# Patient Record
Sex: Female | Born: 1960 | Race: White | Hispanic: No | Marital: Single | Smoking: Current some day smoker
Health system: Southern US, Community
[De-identification: ages and names within clinical notes are randomized; demographics above are authoritative.]

## PROBLEM LIST (undated history)

## (undated) DIAGNOSIS — E785 Hyperlipidemia, unspecified: Secondary | ICD-10-CM

## (undated) DIAGNOSIS — F32A Depression, unspecified: Secondary | ICD-10-CM

## (undated) DIAGNOSIS — N809 Endometriosis, unspecified: Secondary | ICD-10-CM

## (undated) DIAGNOSIS — T7840XA Allergy, unspecified, initial encounter: Secondary | ICD-10-CM

## (undated) DIAGNOSIS — F329 Major depressive disorder, single episode, unspecified: Secondary | ICD-10-CM

## (undated) HISTORY — DX: Major depressive disorder, single episode, unspecified: F32.9

## (undated) HISTORY — DX: Endometriosis, unspecified: N80.9

## (undated) HISTORY — PX: OTHER SURGICAL HISTORY: SHX169

## (undated) HISTORY — DX: Hyperlipidemia, unspecified: E78.5

## (undated) HISTORY — DX: Allergy, unspecified, initial encounter: T78.40XA

## (undated) HISTORY — DX: Depression, unspecified: F32.A

---

## 2010-10-27 ENCOUNTER — Emergency Department (HOSPITAL_COMMUNITY): Payer: Self-pay

## 2010-10-27 ENCOUNTER — Observation Stay (HOSPITAL_COMMUNITY)
Admission: EM | Admit: 2010-10-27 | Discharge: 2010-10-28 | Disposition: A | Discharge status: Home / Self Care | Payer: Self-pay | Attending: Family Medicine | Admitting: Family Medicine

## 2010-10-27 DIAGNOSIS — R55 Syncope and collapse: Principal | ICD-10-CM | POA: Insufficient documentation

## 2010-10-27 DIAGNOSIS — R61 Generalized hyperhidrosis: Secondary | ICD-10-CM | POA: Insufficient documentation

## 2010-10-27 DIAGNOSIS — C3490 Malignant neoplasm of unspecified part of unspecified bronchus or lung: Secondary | ICD-10-CM

## 2010-10-27 DIAGNOSIS — R911 Solitary pulmonary nodule: Secondary | ICD-10-CM | POA: Insufficient documentation

## 2010-10-27 DIAGNOSIS — R209 Unspecified disturbances of skin sensation: Secondary | ICD-10-CM | POA: Insufficient documentation

## 2010-10-27 DIAGNOSIS — R5383 Other fatigue: Secondary | ICD-10-CM | POA: Insufficient documentation

## 2010-10-27 DIAGNOSIS — R110 Nausea: Secondary | ICD-10-CM | POA: Insufficient documentation

## 2010-10-27 DIAGNOSIS — F410 Panic disorder [episodic paroxysmal anxiety] without agoraphobia: Secondary | ICD-10-CM | POA: Insufficient documentation

## 2010-10-27 DIAGNOSIS — R51 Headache: Secondary | ICD-10-CM | POA: Insufficient documentation

## 2010-10-27 DIAGNOSIS — R0602 Shortness of breath: Secondary | ICD-10-CM | POA: Insufficient documentation

## 2010-10-27 DIAGNOSIS — R5381 Other malaise: Secondary | ICD-10-CM | POA: Insufficient documentation

## 2010-10-27 DIAGNOSIS — F17200 Nicotine dependence, unspecified, uncomplicated: Secondary | ICD-10-CM | POA: Insufficient documentation

## 2010-10-27 LAB — POCT I-STAT, CHEM 8
Calcium, Ion: 1.2 mmol/L (ref 1.12–1.32)
Chloride: 103 mEq/L (ref 96–112)
Creatinine, Ser: 0.8 mg/dL (ref 0.50–1.10)
Glucose, Bld: 130 mg/dL — ABNORMAL HIGH (ref 70–99)
HCT: 44 % (ref 36.0–46.0)
Hemoglobin: 15 g/dL (ref 12.0–15.0)

## 2010-10-27 LAB — URINALYSIS, ROUTINE W REFLEX MICROSCOPIC
Nitrite: NEGATIVE
Specific Gravity, Urine: 1.016 (ref 1.005–1.030)
Urobilinogen, UA: 0.2 mg/dL (ref 0.0–1.0)
pH: 6.5 (ref 5.0–8.0)

## 2010-10-27 LAB — CBC
HCT: 43.6 % (ref 36.0–46.0)
Hemoglobin: 14.9 g/dL (ref 12.0–15.0)
MCH: 32.1 pg (ref 26.0–34.0)
MCHC: 34.2 g/dL (ref 30.0–36.0)
MCV: 94 fL (ref 78.0–100.0)

## 2010-10-27 LAB — TROPONIN I: Troponin I: <0.3 ng/mL (ref <0.30)

## 2010-10-27 LAB — DIFFERENTIAL
Lymphocytes Relative: 43 % (ref 12–46)
Lymphs Abs: 2.9 10*3/uL (ref 0.7–4.0)
Monocytes Absolute: 0.5 10*3/uL (ref 0.1–1.0)
Monocytes Relative: 8 % (ref 3–12)
Neutro Abs: 3.2 10*3/uL (ref 1.7–7.7)

## 2010-10-27 LAB — URINE MICROSCOPIC-ADD ON

## 2010-10-27 LAB — GLUCOSE, CAPILLARY: Glucose-Capillary: 112 mg/dL — ABNORMAL HIGH (ref 70–99)

## 2010-10-27 MED ORDER — IOHEXOL 300 MG/ML  SOLN
100.0000 mL | Freq: Once | INTRAMUSCULAR | Status: AC | PRN
  Administered 2010-10-27: 100 mL via INTRAVENOUS

## 2010-10-28 ENCOUNTER — Observation Stay (HOSPITAL_COMMUNITY): Payer: Self-pay

## 2010-10-28 DIAGNOSIS — R55 Syncope and collapse: Secondary | ICD-10-CM

## 2010-10-28 LAB — CARDIAC PANEL(CRET KIN+CKTOT+MB+TROPI)
CK, MB: 1.7 ng/mL (ref 0.3–4.0)
Relative Index: 1.9 (ref 0.0–2.5)
Relative Index: INVALID (ref 0.0–2.5)
Troponin I: <0.3 ng/mL (ref <0.30)
Troponin I: <0.3 ng/mL (ref <0.30)

## 2010-10-28 LAB — LIPID PANEL
Cholesterol: 215 mg/dL — ABNORMAL HIGH (ref 0–200)
HDL: 76 mg/dL (ref >39)
LDL Cholesterol: 117 mg/dL — ABNORMAL HIGH (ref 0–99)
Total CHOL/HDL Ratio: 2.8 RATIO
Triglycerides: 111 mg/dL (ref <150)
VLDL: 22 mg/dL (ref 0–40)

## 2010-10-28 LAB — COMPREHENSIVE METABOLIC PANEL
AST: 16 U/L (ref 0–37)
Albumin: 3.3 g/dL — ABNORMAL LOW (ref 3.5–5.2)
BUN: 14 mg/dL (ref 6–23)
Chloride: 104 mEq/L (ref 96–112)
Creatinine, Ser: 0.81 mg/dL (ref 0.50–1.10)
Total Bilirubin: 0.2 mg/dL — ABNORMAL LOW (ref 0.3–1.2)
Total Protein: 6.5 g/dL (ref 6.0–8.3)

## 2010-10-29 NOTE — Procedures (Unsigned)
REFERRING PHYSICIAN:  Santiago Bumpers. Hensel, MD  HISTORY:  The patient is a 50 year old woman being evaluated for syncopal episode, EEG for further evaluation.  MEDICATION:  Zofran, Tylenol.  EEG DURATION:  22 minutes of EEG recording.  EEG DESCRIPTION:  This is a routine 18-channel adult EEG recording with one-channel devoted to limited EKG recording.  Activation procedure was performed using the photic stimulation and hyperventilation. As the EEG opens up, I noticed that the posterior dominant rhythm consists of 9-10 Hz frequency, well-formed symmetrical rhythm with an amplitude of up to 28 microvolts.  The rhythm is symmetrical and continuous.  There is no electrographic seizure or epileptiform discharges recorded during this study.  Symmetrical driving was noted with the photic stimulation in posterior leads.  No buildup of generalized slowing was observed with the hyperventilation.  No sleep architecture was recorded during the study either.  EEG INTERPRETATION:  This is a normal awake EEG.  There is no evidence to suggest electrographic seizures or epileptiform discharges based on the study.          ______________________________ Levie Heritage, MD    ZO:XWRU D:  10/28/2010 15:30:32  T:  10/29/2010 01:07:23  Job #:  [id number]
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
## 2010-11-24 NOTE — Discharge Summary (Signed)
  NAMESYLWIA, Isabella Cannon NO.:  [id number]  MEDICAL RECORD NO.:  [id number]  LOCATION:  2007                         FACILITY:  [facility]  PHYSICIAN:  Santiago Bumpers. Hensel, M.D.DATE OF BIRTH:  1961-02-13  DATE OF ADMISSION:  10/27/2010 DATE OF DISCHARGE:  10/28/2010                              DISCHARGE SUMMARY   DISCHARGE DIAGNOSES: 1. Syncope versus panic attack. 2. Lung nodules less than 5 mm.  DISCHARGE MEDICATIONS:  None.  CONSULTS:  None.  LABORATORY DATA:  Cardiac enzymes x3 negative.  A1c 5.6, TSH 1.5, lipid profile with 80, HDL 76, LDL 117, triglycerides 111, creatinine 0.81, AST 16, ALT 18, calcium 9.4.  PERTINENT STUDIES: 1. Echo with ejection fraction of 55-60%, normal carotid Dopplers with     no significant carotid artery stenosis demonstrated.  Vertebral     with antegrade flow. 2. EEG normal awake. 3. Chest x-ray, vague nodular opacity posterior to tip of vertebral body and lateral image. 4. CTA, negative for PE.  Small nonspecific nodules in both apices.     None greater than 5 mm.  Followup CT chest in 6-12 months. 5. CT of the head normal study.  BRIEF HOSPITAL COURSE:  This is a 50 year old female with the history of multiple stressors lately that is admitted for syncopal episodes. 1. Syncope versus panic attack.  The patient mentioned that the     episode of the syncope are proceeded by diaphoresis, palpitation.     There is no loss of consciousness associated.  No movements of     extremities.  No trauma due to fall. They have happened 3-4 times since last     month.  During hospitalization, none of these episodes were     repeated.  All tests performed were negative for cardiovascular or     neurologic etiology.  We will recommend follow as an outpatient and     address the patient's psychological status as anxiety/panic attacks     could be an important diagnosis in this case. 2. Nodules on CTA/chest x-ray.  No symptoms associated with  this      nodules found on CTA and chest x-ray.  We agree with  Radiology     recommendation and follow up with CT in 6 month as none of them was     measured greater than 5 mm.  DISCHARGE INSTRUCTIONS: 1. Activity, return to her daily living activities.  Followup     precautions until diagnosis established. 2. Regular diet. 3. Appointment, the patient moved recently from [place].  We will     recommend establish care and make her own appointment with her new     PCP provider within 7-10 days from today. 4. The patient is discharged home in stable medical condition.    ______________________________ Wayne Both, MD   ______________________________ Santiago Bumpers Leveda Anna, M.D.    DP/MEDQ  D:  11/02/2010  T:  11/02/2010  Job:  [id number]  Electronically Signed by Delorse Lek PAZ  on 11/10/2010 11:11:19 PM Electronically Signed by Doralee Albino M.D. on 11/24/2010 09:27:32 AM

## 2013-03-14 ENCOUNTER — Ambulatory Visit: Payer: Self-pay

## 2013-03-14 ENCOUNTER — Other Ambulatory Visit: Payer: Self-pay | Admitting: Occupational Medicine

## 2013-03-14 DIAGNOSIS — R52 Pain, unspecified: Secondary | ICD-10-CM

## 2013-08-02 ENCOUNTER — Encounter: Payer: Self-pay | Admitting: Family Medicine

## 2013-08-02 ENCOUNTER — Ambulatory Visit (INDEPENDENT_AMBULATORY_CARE_PROVIDER_SITE_OTHER): Payer: BC Managed Care – PPO | Admitting: Family Medicine

## 2013-08-02 VITALS — BP 100/70 | HR 81 | Temp 98.4°F | Ht 64.25 in | Wt 152.0 lb

## 2013-08-02 DIAGNOSIS — F17200 Nicotine dependence, unspecified, uncomplicated: Secondary | ICD-10-CM

## 2013-08-02 DIAGNOSIS — Z7189 Other specified counseling: Secondary | ICD-10-CM

## 2013-08-02 DIAGNOSIS — Z7689 Persons encountering health services in other specified circumstances: Secondary | ICD-10-CM

## 2013-08-02 DIAGNOSIS — R918 Other nonspecific abnormal finding of lung field: Secondary | ICD-10-CM

## 2013-08-02 DIAGNOSIS — Z720 Tobacco use: Secondary | ICD-10-CM

## 2013-08-02 NOTE — Progress Notes (Signed)
Pre visit review using our clinic review tool, if applicable. No additional management support is needed unless otherwise documented below in the visit note. 

## 2013-08-02 NOTE — Progress Notes (Signed)
No chief complaint on file.   HPI:  Isabella Cannon is here to establish care.  Last PCP and physical: not done in a long time as has not had PCP.  Has the following chronic problems and concerns today:  There are no active problems to display for this patient.  Pulmonary Nodules: -multiple on CT  -reports never did repeat CT scan -denies: weight loss, chronic cough, wt loss, smoker - has smoked for 35 years, about 1/2 ppd  Allergies: -worse in the spring -using cvs antihistamine and doing well  Tobacco User: -has reasons to quit, but not a good time for her right now, moving and stressed -will think about it   ROS: See pertinent positives and negatives per HPI.  Past Medical History  Diagnosis Date  . Allergy   . Endometriosis     Family History  Problem Relation Age of Onset  . COPD Father   . Heart disease Father     chf, MI  . Hyperlipidemia Father   . Hypertension Father   . Arthritis Sister     History   Social History  . Marital Status: Single    Spouse Name: N/A    Number of Children: N/A  . Years of Education: N/A   Social History Main Topics  . Smoking status: Current Some Day Smoker  . Smokeless tobacco: None  . Alcohol Use: Yes     Comment: 4-5 drinks in a day; 2 times per week  . Drug Use: None  . Sexual Activity: None   Other Topics Concern  . 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  GENERAL: vitals reviewed and listed above, alert, oriented,  appears well hydrated and in no acute distress  HEENT: atraumatic, conjunttiva clear, no obvious abnormalities on inspection of external nose and ears  NECK: no obvious masses on inspection  LUNGS: clear to auscultation bilaterally, no wheezes, rales or rhonchi, good air movement  CV: HRRR, no peripheral edema  MS: moves all extremities without noticeable abnormality  PSYCH: pleasant and cooperative, no obvious depression or anxiety  ASSESSMENT AND PLAN:  Discussed the following assessment and plan:  Pulmonary nodules, incidental 2012 (she never did repeat scan) - Plan: CT Chest Wo Contrast -noticed when reviewing chart, she reports she never had repeat scan, chronic smoker, no symptoms of lung disease or malignancy -CT scan advised and ordered  Tobacco use -smoking cessation >3<10 minutes, pamphlet provided  Encounter to establish care  -We reviewed the PMH, PSH, FH, SH, Meds and Allergies. -We provided refills for any medications we will prescribe as needed. -We addressed current concerns per orders and patient instructions. -We have asked for records for pertinent exams, studies, vaccines and notes from previous providers. -We have advised patient to follow up per instructions below.   -Patient advised to return or notify a doctor immediately if symptoms worsen  or persist or new concerns arise.  Patient Instructions  -We placed a referral for you as discussed for the CT scan for the pulmonary nodules. It usually takes about 1-2 weeks to process and schedule this referral. If you have not heard from [place] regarding this appointment in 2 weeks please contact our office.  -PLEASE SIGN UP FOR MYCHART TODAY   We recommend the following healthy lifestyle measures: - eat a healthy diet consisting of lots of vegetables, fruits, beans, nuts, seeds, healthy meats such as white chicken and fish and whole grains.  - avoid fried foods, fast food, processed foods, sodas, red meet and  other fattening foods.  - get a least 150 minutes of aerobic exercise per week.   Follow up in: the next 1-3 months for a physical exam with labs      Lucretia Kern

## 2013-08-02 NOTE — Patient Instructions (Signed)
-  We placed a referral for you as discussed for the CT scan for the pulmonary nodules. It usually takes about 1-2 weeks to process and schedule this referral. If you have not heard from [place] regarding this appointment in 2 weeks please contact our office.  -PLEASE SIGN UP FOR MYCHART TODAY   We recommend the following healthy lifestyle measures: - eat a healthy diet consisting of lots of vegetables, fruits, beans, nuts, seeds, healthy meats such as white chicken and fish and whole grains.  - avoid fried foods, fast food, processed foods, sodas, red meet and other fattening foods.  - get a least 150 minutes of aerobic exercise per week.   Follow up in: the next 1-3 months for a physical exam with labs

## 2013-08-03 ENCOUNTER — Telehealth: Payer: Self-pay | Admitting: Family Medicine

## 2013-08-03 NOTE — Telephone Encounter (Signed)
Relevant patient education mailed to patient.  

## 2013-08-06 ENCOUNTER — Inpatient Hospital Stay: Admission: RE | Admit: 2013-08-06 | Payer: Self-pay | Source: Ambulatory Visit

## 2013-08-07 ENCOUNTER — Telehealth: Payer: Self-pay | Admitting: Family Medicine

## 2013-08-07 NOTE — Telephone Encounter (Signed)
It appears she missed th CT scan for her pulm nodules. I would advise her to reschedule this if has not done so already.

## 2013-08-08 NOTE — Telephone Encounter (Signed)
lmtcb

## 2013-08-08 NOTE — Telephone Encounter (Signed)
Pt notified and she has rescheduled

## 2013-08-10 ENCOUNTER — Other Ambulatory Visit: Payer: Self-pay

## 2013-09-27 ENCOUNTER — Other Ambulatory Visit (HOSPITAL_COMMUNITY)
Admission: RE | Admit: 2013-09-27 | Discharge: 2013-09-27 | Disposition: A | Discharge status: Home / Self Care | Payer: BC Managed Care – PPO | Source: Ambulatory Visit | Attending: Family Medicine | Admitting: Family Medicine

## 2013-09-27 ENCOUNTER — Ambulatory Visit (INDEPENDENT_AMBULATORY_CARE_PROVIDER_SITE_OTHER): Payer: BC Managed Care – PPO | Admitting: Family Medicine

## 2013-09-27 ENCOUNTER — Encounter: Payer: Self-pay | Admitting: Family Medicine

## 2013-09-27 VITALS — BP 100/74 | HR 80 | Temp 97.7°F | Ht 65.25 in | Wt 152.0 lb

## 2013-09-27 DIAGNOSIS — F411 Generalized anxiety disorder: Secondary | ICD-10-CM

## 2013-09-27 DIAGNOSIS — R232 Flushing: Secondary | ICD-10-CM

## 2013-09-27 DIAGNOSIS — Z1211 Encounter for screening for malignant neoplasm of colon: Secondary | ICD-10-CM

## 2013-09-27 DIAGNOSIS — F17200 Nicotine dependence, unspecified, uncomplicated: Secondary | ICD-10-CM

## 2013-09-27 DIAGNOSIS — R911 Solitary pulmonary nodule: Secondary | ICD-10-CM

## 2013-09-27 DIAGNOSIS — Z0000 Encounter for general adult medical examination without abnormal findings: Secondary | ICD-10-CM

## 2013-09-27 DIAGNOSIS — Z1151 Encounter for screening for human papillomavirus (HPV): Secondary | ICD-10-CM | POA: Insufficient documentation

## 2013-09-27 DIAGNOSIS — F419 Anxiety disorder, unspecified: Secondary | ICD-10-CM

## 2013-09-27 DIAGNOSIS — N951 Menopausal and female climacteric states: Secondary | ICD-10-CM

## 2013-09-27 DIAGNOSIS — Z01419 Encounter for gynecological examination (general) (routine) without abnormal findings: Secondary | ICD-10-CM | POA: Insufficient documentation

## 2013-09-27 LAB — LIPID PANEL
CHOLESTEROL: 257 mg/dL — AB (ref 0–200)
HDL: 69.5 mg/dL (ref >39.00)
LDL Cholesterol: 163 mg/dL — ABNORMAL HIGH (ref 0–99)
NonHDL: 187.5
TRIGLYCERIDES: 121 mg/dL (ref 0.0–149.0)
Total CHOL/HDL Ratio: 4
VLDL: 24.2 mg/dL (ref 0.0–40.0)

## 2013-09-27 LAB — BASIC METABOLIC PANEL
BUN: 19 mg/dL (ref 6–23)
CO2: 28 meq/L (ref 19–32)
Calcium: 9.6 mg/dL (ref 8.4–10.5)
Chloride: 104 mEq/L (ref 96–112)
Creatinine, Ser: 0.8 mg/dL (ref 0.4–1.2)
GFR: 80.69 mL/min (ref >60.00)
GLUCOSE: 89 mg/dL (ref 70–99)
POTASSIUM: 4.1 meq/L (ref 3.5–5.1)
SODIUM: 139 meq/L (ref 135–145)

## 2013-09-27 LAB — HEMOGLOBIN A1C: Hgb A1c MFr Bld: 5.9 % (ref 4.6–6.5)

## 2013-09-27 MED ORDER — PAROXETINE HCL 10 MG PO TABS
10.0000 mg | ORAL_TABLET | Freq: Every day | ORAL | Status: DC
Start: 2013-09-27 — End: 2015-02-17

## 2013-09-27 NOTE — Patient Instructions (Signed)
-  schedule your CT scan for your lungs  -schedule your mammogram: [phone]  -Vit D3 1000 IU daily; 1200mg  daily from diet and supplement  -quit smoking  -start the paroxetine (paxil) daily for stress and hot flashes - do not stop suddenly, follow up in 2-3 months  -We placed a referral for you as discussed for the colonoscopy. It usually takes about 1-2 weeks to process and schedule this referral. If you have not heard from [place] regarding this appointment in 2 weeks please contact our office.   -We have ordered labs or studies at this visit. It can take up to 1-2 weeks for results and processing. We will contact you with instructions IF your results are abnormal. Normal results will be released to your [facility]. If you have not heard from [place] or can not find your results in [facility] in 2 weeks please contact our office.  We recommend the following healthy lifestyle measures: - eat a healthy diet consisting of lots of vegetables, fruits, beans, nuts, seeds, healthy meats such as white chicken and fish and whole grains.  - avoid fried foods, fast food, processed foods, sodas, red meet and other fattening foods.  - get a least 150 minutes of aerobic exercise per week.

## 2013-09-27 NOTE — Progress Notes (Signed)
Pre visit review using our clinic review tool, if applicable. No additional management support is needed unless otherwise documented below in the visit note. 

## 2013-09-27 NOTE — Addendum Note (Signed)
Addended by: Agnes Lawrence on: 09/27/2013 09:13 AM   Modules accepted: Orders

## 2013-09-27 NOTE — Progress Notes (Signed)
No chief complaint on file.   HPI:  Here for CPE:  -Concerns/follow up today:   Pulmonary Nodules:  -multiple on CT  -reports never did repeat CT scan that we ordered and she missed and we contacted her to reschedule - but she has not done this yet but plans on doing this -denies: weight loss, chronic cough, wt loss, smoker - has smoked for 35 years, about 1/2 ppd   Allergies:  -worse in the spring  -using cvs antihistamine and doing well   Tobacco User:  -has reasons to quit, but not a good time for her right now, moving and stressed  -has cut back to 5 cigs per day - not ready to quit -has a lot going on, father is dying and moving  Hot Flashes: -started several years ago with menopause -wants to try effexor  -Diet: variety of foods, balance and well rounded - wants to work on portion sizes  -Taking vitamin d or calcium: no  -Exercise: no regular exercise  -Diabetes and Dyslipidemia Screening: doing today  -Hx of HTN: no  -Vaccines: refused tdap  -pap history: 4 years ago, normal, willing to get today  -FDLMP:N/A  -sexual activity: yes, female partner, no new partners  -wants STI testing: no  -FH breast, colon or ovarian ca: see FH -last mammo:  4 years ago -colon cancer screening: willing to schedule colonoscopy - referral placed  -Alcohol, Tobacco, drug use: see social history  Review of Systems - Review of Systems  Constitutional: Negative for fever, weight loss and malaise/fatigue.  HENT: Negative for hearing loss.   Eyes: Negative for blurred vision and double vision.  Respiratory: Negative for cough, hemoptysis and shortness of breath.   Cardiovascular: Negative for chest pain, palpitations and leg swelling.  Gastrointestinal: Negative for heartburn, vomiting, diarrhea, blood in stool and melena.  Genitourinary: Negative for dysuria and hematuria.  Musculoskeletal: Negative for falls and myalgias.  Skin: Negative for rash.  Neurological: Negative  for dizziness and seizures.  Endo/Heme/Allergies: Does not bruise/bleed easily.  Psychiatric/Behavioral: Negative for depression, suicidal ideas and memory loss.     Past Medical History  Diagnosis Date  . Allergy   . Endometriosis     No past surgical history on file.  Family History  Problem Relation Age of Onset  . COPD Father   . Heart disease Father     chf, MI  . Hyperlipidemia Father   . Hypertension Father   . Arthritis Sister     History   Social History  . Marital Status: Single    Spouse Name: N/A    Number of Children: N/A  . Years of Education: N/A   Social History Main Topics  . Smoking status: Current Some Day Smoker  . Smokeless tobacco: None  . Alcohol Use: Yes     Comment: 4-5 drinks in a day; 2 times per week  . Drug Use: None  . Sexual Activity: None   Other Topics Concern  . 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 moist mucus membranes.  NECK: supple, no masses or lymphadenopathy  LUNGS: clear to auscultation bilaterally, no rales, rhonchi or wheeze  CV: HRRR, no peripheral edema or cyanosis, normal pedal pulses  BREAST: normal appearance - no lesions or discharge, on palpation normal breast tissue without any suspicious masses  ABDOMEN: bowel sounds normal, soft, non tender to palpation, no masses, no rebound or  guarding  GU: normal appearance of external genitalia - no lesions or masses, normal vaginal mucosa - no abnormal discharge, normal appearance of cervix - no lesions or abnormal discharge, no masses or tenderness on palpation of uterus and ovaries.  RECTAL: refused  SKIN: no rash or abnormal lesions  MS: normal gait, moves all extremities normally  NEURO: CN II-XII grossly intact, normal muscle strength and sensation to light touch on extremities  PSYCH: normal affect, pleasant and cooperative  ASSESSMENT AND PLAN:  Discussed the following assessment and plan:  Colon cancer screening - Plan: Ambulatory referral to Gastroenterology  Visit for preventive health examination - Plan: Lipid Panel, Hemoglobin W5Y, Basic metabolic panel  Pulmonary nodule  Tobacco use disorder  Hot flashes - Plan: PARoxetine (PAXIL) 10 MG tablet  Anxiety disorder, unspecified anxiety disorder type - Plan: PARoxetine (PAXIL) 10 MG tablet  -Female with the following health issues:  -Discussed and advised all [place] preventive services health task force level A and B recommendations for age, sex and risks.  -Advised at least 150 minutes of exercise per week and a healthy diet low in saturated fats and sweets and consisting of fresh fruits and vegetables, lean meats such as fish and white chicken and whole grains.  -smoking cessation counseling 3-10 minutes, she is cutting back, is aware of risks, not not willing to quit at this time  -disucssed options and risks for treatment of hot flashes and opted for trial of low dose paxil as also is quite stressed at this time - follow up 2 months  -pap optained, initially she reported normal pap smear 4 years ago, then ? milldy abnormal  -advised again repeat imaging for pulmonary nodule - she declined assistance with scheduling this but reports she will call to schedule  -labs, studies and vaccines per orders this encounter  Orders Placed This Encounter   Procedures  . Lipid Panel  . Hemoglobin A1c  . Basic metabolic panel  . Ambulatory referral to Gastroenterology    Referral Priority:  Routine    Referral Type:  Consultation    Referral Reason:  Specialty Services Required    Requested Specialty:  Gastroenterology    Number of Visits Requested:  1    Patient Instructions  -schedule your CT scan for your lungs  -schedule your mammogram: [phone]  -Vit D3 1000 IU daily; 1200mg  daily from diet and supplement  -quit smoking  -start the paroxetine (paxil) daily for stress and hot flashes - do not stop suddenly, follow up in 2-3 months  -We placed a referral for you as discussed for the colonoscopy. It usually takes about 1-2 weeks to process and schedule this referral. If you have not heard from [place] regarding this appointment in 2 weeks please contact our office.   -We have ordered labs or studies at this visit. It can  take up to 1-2 weeks for results and processing. We will contact you with instructions IF your results are abnormal. Normal results will be released to your [facility]. If you have not heard from [place] or can not find your results in [facility] in 2 weeks please contact our office.  We recommend the following healthy lifestyle measures: - eat a healthy diet consisting of lots of vegetables, fruits, beans, nuts, seeds, healthy meats such as white chicken and fish and whole grains.  - avoid fried foods, fast food, processed foods, sodas, red meet and other fattening foods.  - get a least 150 minutes of aerobic exercise per week.           Patient advised to return to clinic immediately if symptoms worsen or persist or new concerns.   Return in about 2 months (around 11/27/2013) for FOLLOW UP.  Isabella Cannon.

## 2013-09-28 LAB — CYTOLOGY - PAP

## 2013-11-01 ENCOUNTER — Encounter: Payer: Self-pay | Admitting: Family Medicine

## 2013-11-27 ENCOUNTER — Ambulatory Visit: Payer: Self-pay | Admitting: Family Medicine

## 2014-08-26 ENCOUNTER — Telehealth: Payer: Self-pay | Admitting: *Deleted

## 2014-08-26 NOTE — Telephone Encounter (Signed)
Patient called back stating she hasn't had a mammogram done in a while.

## 2014-08-26 NOTE — Telephone Encounter (Signed)
Left message for pt to call back.  Need to know what date she had mammogram and where 

## 2014-08-27 NOTE — Telephone Encounter (Signed)
Pt declined mammogram.  Chart updated

## 2015-02-17 ENCOUNTER — Encounter: Payer: Self-pay | Admitting: Family Medicine

## 2015-02-17 ENCOUNTER — Ambulatory Visit (INDEPENDENT_AMBULATORY_CARE_PROVIDER_SITE_OTHER): Payer: BC Managed Care – PPO | Admitting: Family Medicine

## 2015-02-17 VITALS — BP 118/70 | HR 76 | Temp 98.2°F | Ht 65.25 in

## 2015-02-17 DIAGNOSIS — L219 Seborrheic dermatitis, unspecified: Secondary | ICD-10-CM

## 2015-02-17 DIAGNOSIS — J069 Acute upper respiratory infection, unspecified: Secondary | ICD-10-CM | POA: Diagnosis not present

## 2015-02-17 NOTE — Patient Instructions (Signed)
FOR the dry skin on the ear: -use dandruff shampoo to wash this area a few times per week -hydrocortisone cream 1-2 times daily -follow up if persists  INSTRUCTIONS FOR UPPER RESPIRATORY INFECTION:  -plenty of rest and fluids  -nasal saline wash 2-3 times daily (use prepackaged nasal saline or bottled/distilled water if making your own)   -can use AFRIN nasal spray for drainage and nasal congestion - but do NOT use longer then 3-4 days  -can use tylenol (in no history of liver disease) or ibuprofen (if no history of kidney disease, bowel bleeding or significant heart disease) as directed for aches and sorethroat  -in the winter time, using a humidifier at night is helpful (please follow cleaning instructions)  -if you are taking a cough medication - use only as directed, may also try a teaspoon of honey to coat the throat and throat lozenges. If given a cough medication with codeine or hydrocodone or other narcotic please be advised that this contains a strong and  potentially addicting medication. Please follow instructions carefully, take as little as possible and only use AS NEEDED for severe cough. Discuss potential side effects with your pharmacy. Please do not drive or operate machinery while taking these types of medications. Please do not take other sedating medications, drugs or alcohol while taking this medication without discussing with your doctor.  -for sore throat, salt water gargles can help  -follow up if you have fevers, facial pain, tooth pain, difficulty breathing or are worsening or symptoms persist longer then expected  Upper Respiratory Infection, Adult An upper respiratory infection (URI) is also known as the common cold. It is often caused by a type of germ (virus). Colds are easily spread (contagious). You can pass it to others by kissing, coughing, sneezing, or drinking out of the same glass. Usually, you get better in 1 to 3  weeks.  However, the cough can last for  even longer. HOME CARE   Only take medicine as told by your doctor. Follow instructions provided above.  Drink enough water and fluids to keep your pee (urine) clear or pale yellow.  Get plenty of rest.  Return to work when your temperature is < 100 for 24 hours or as told by your doctor. You may use a face mask and wash your hands to stop your cold from spreading. GET HELP RIGHT AWAY IF:   After the first few days, you feel you are getting worse.  You have questions about your medicine.  You have chills, shortness of breath, or red spit (mucus).  You have pain in the face for more then 1-2 days, especially when you bend forward.  You have a fever, puffy (swollen) neck, pain when you swallow, or white spots in the back of your throat.  You have a bad headache, ear pain, sinus pain, or chest pain.  You have a high-pitched whistling sound when you breathe in and out (wheezing).  You cough up blood.  You have sore muscles or a stiff neck. MAKE SURE YOU:   Understand these instructions.  Will watch your condition.  Will get help right away if you are not doing well or get worse. Document Released: 09/08/2007 Document Revised: 06/14/2011 Document Reviewed: 06/27/2013 [facility] Patient Information 2015 [place], [place]. This information is not intended to replace advice given to you by your health care provider. Make sure you discuss any questions you have with your health care provider.

## 2015-02-17 NOTE — Progress Notes (Signed)
HPI:  URI: -started:3 days ago -symptoms:nasal congestion, sore throat, cough, L ear pain -denies:fever, SOB, NVD, tooth pain -has tried: nothing -sick contacts/travel/risks: denies flu exposure, tick exposure or or Ebola risks -Hx of: allergies ROS: See pertinent positives and negatives per HPI.  Past Medical History  Diagnosis Date  . Allergy   . Endometriosis     No past surgical history on file.  Family History  Problem Relation Age of Onset  . COPD Father   . Heart disease Father     chf, MI  . Hyperlipidemia Father   . Hypertension Father   . Arthritis Sister     Social History   Social History  . Marital Status: Single    Spouse Name: N/A  . Number of Children: N/A  . Years of Education: N/A   Social History Main Topics  . Smoking status: Former Smoker    Quit date: 12/05/2014  . Smokeless tobacco: None  . Alcohol Use: 0.0 oz/week    0 Standard drinks or equivalent per week     Comment: 4-5 drinks in a day; 2 times per week  . Drug Use: None  . Sexual Activity: Not Asked   Other Topics Concern  . 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  EXAM:  Filed Vitals:   02/17/15 1434  BP: 118/70  Pulse: 76  Temp: 98.2 F (36.8 C)    There is no weight on file to calculate BMI.  GENERAL: vitals reviewed and listed above, alert, oriented, appears well hydrated and in no acute distress  HEENT: atraumatic, conjunttiva clear, no obvious abnormalities on inspection of external nose and ears except for dry scaly skin on L ear and behind ear, normal appearance of ear canals and TMs except for clear effusion bilat, clear nasal congestion, mild post oropharyngeal erythema with PND, no tonsillar edema or exudate, no  sinus TTP  NECK: no obvious masses on inspection  LUNGS: clear to auscultation bilaterally, no wheezes, rales or rhonchi, good air movement  CV: HRRR, no peripheral edema  MS: moves all extremities without noticeable abnormality  PSYCH: pleasant and cooperative, no obvious depression or anxiety  ASSESSMENT AND PLAN:  Discussed the following assessment and plan:  Acute upper respiratory infection  Seborrheic dermatitis  -given HPI and exam findings today, a serious infection or illness is unlikely. We discussed potential etiologies, with VURI being most likely, and advised supportive care and monitoring. We discussed treatment side effects, likely course, antibiotic misuse, transmission, and signs of developing a serious illness. -of course, we advised to return or notify a doctor immediately if symptoms worsen or persist or new concerns arise.    Patient Instructions  FOR the dry skin on the ear: -use dandruff shampoo to wash this area a few times per week -hydrocortisone cream 1-2 times daily -follow up if persists  INSTRUCTIONS FOR UPPER RESPIRATORY INFECTION:  -plenty of rest and fluids  -nasal saline wash 2-3 times daily (use prepackaged nasal saline or bottled/distilled water if making your own)   -can use AFRIN nasal spray for drainage and nasal congestion - but do NOT use longer then 3-4 days  -  can use tylenol (in no history of liver disease) or ibuprofen (if no history of kidney disease, bowel bleeding or significant heart disease) as directed for aches and sorethroat  -in the winter time, using a humidifier at night is helpful (please follow cleaning instructions)  -if you are taking a cough medication - use only as directed, may also try a teaspoon of honey to coat the throat and throat lozenges. If given a cough medication with codeine or hydrocodone or other narcotic please be advised that this contains a strong and  potentially addicting medication. Please  follow instructions carefully, take as little as possible and only use AS NEEDED for severe cough. Discuss potential side effects with your pharmacy. Please do not drive or operate machinery while taking these types of medications. Please do not take other sedating medications, drugs or alcohol while taking this medication without discussing with your doctor.  -for sore throat, salt water gargles can help  -follow up if you have fevers, facial pain, tooth pain, difficulty breathing or are worsening or symptoms persist longer then expected  Upper Respiratory Infection, Adult An upper respiratory infection (URI) is also known as the common cold. It is often caused by a type of germ (virus). Colds are easily spread (contagious). You can pass it to others by kissing, coughing, sneezing, or drinking out of the same glass. Usually, you get better in 1 to 3  weeks.  However, the cough can last for even longer. HOME CARE   Only take medicine as told by your doctor. Follow instructions provided above.  Drink enough water and fluids to keep your pee (urine) clear or pale yellow.  Get plenty of rest.  Return to work when your temperature is < 100 for 24 hours or as told by your doctor. You may use a face mask and wash your hands to stop your cold from spreading. GET HELP RIGHT AWAY IF:   After the first few days, you feel you are getting worse.  You have questions about your medicine.  You have chills, shortness of breath, or red spit (mucus).  You have pain in the face for more then 1-2 days, especially when you bend forward.  You have a fever, puffy (swollen) neck, pain when you swallow, or white spots in the back of your throat.  You have a bad headache, ear pain, sinus pain, or chest pain.  You have a high-pitched whistling sound when you breathe in and out (wheezing).  You cough up blood.  You have sore muscles or a stiff neck. MAKE SURE YOU:   Understand these instructions.  Will  watch your condition.  Will get help right away if you are not doing well or get worse. Document Released: 09/08/2007 Document Revised: 06/14/2011 Document Reviewed: 06/27/2013 [facility] Patient Information 2015 [place], [place]. This information is not intended to replace advice given to you by your health care provider. Make sure you discuss any questions you have with your health care provider.      Colin Benton R.

## 2015-02-17 NOTE — Progress Notes (Signed)
Pre visit review using our clinic review tool, if applicable. No additional management support is needed unless otherwise documented below in the visit note. Patient declines weight measurement today. 

## 2015-05-28 ENCOUNTER — Ambulatory Visit (INDEPENDENT_AMBULATORY_CARE_PROVIDER_SITE_OTHER): Payer: BC Managed Care – PPO | Admitting: Family Medicine

## 2015-05-28 ENCOUNTER — Encounter: Payer: Self-pay | Admitting: Family Medicine

## 2015-05-28 VITALS — BP 100/70 | HR 68 | Temp 98.5°F | Ht 65.25 in

## 2015-05-28 DIAGNOSIS — J329 Chronic sinusitis, unspecified: Secondary | ICD-10-CM

## 2015-05-28 DIAGNOSIS — J310 Chronic rhinitis: Secondary | ICD-10-CM

## 2015-05-28 MED ORDER — FLUTICASONE PROPIONATE 50 MCG/ACT NA SUSP
2.0000 | Freq: Every day | NASAL | Status: DC
Start: 2015-05-28 — End: 2015-08-28

## 2015-05-28 NOTE — Patient Instructions (Signed)
Before you leave: -Schedule your physical in 3 months, please come fasting and we will plan to do lab work that day- you may drink water and black coffee  Please start the following medications: -Afrin nasal spray, available over-the-counter, twice daily for 3 days then stop -Flonase 2 sprays each nostril daily for 21 days, then 1 spray each nostril daily if needed -Zyrtec or Allegra once daily, this is available over-the-counter Please follow up if your symptoms worsen or persist despite these treatments.  Please check with your insurance company and the cost of the various Colon Cancer screening options including:  cologuard and colonosocpy

## 2015-05-28 NOTE — Progress Notes (Signed)
Pre visit review using our clinic review tool, if applicable. No additional management support is needed unless otherwise documented below in the visit note. Patient declines weight measurement today. 

## 2015-05-28 NOTE — Progress Notes (Signed)
HPI:  Nasal congestion: -started: About 1 month ago, not worsening -symptoms: Clear nasal congestion, postnasal drip, cough, sneezing, itchy watery eyes -denies:fever, SOB, NVD, tooth pain, thick mucus, sinus pain, ear pain -has tried: Mucinex -sick contacts/travel/risks: denies flu exposure -Hx of: allergies-she was not aware of this diagnosis  ROS: See pertinent positives and negatives per HPI.  Past Medical History  Diagnosis Date  . Allergy   . Endometriosis     No past surgical history on file.  Family History  Problem Relation Age of Onset  . COPD Father   . Heart disease Father     chf, MI  . Hyperlipidemia Father   . Hypertension Father   . Arthritis Sister     Social History   Social History  . Marital Status: Single    Spouse Name: N/A  . Number of Children: N/A  . Years of Education: N/A   Social History Main Topics  . Smoking status: Former Smoker    Quit date: 12/05/2014  . Smokeless tobacco: None  . Alcohol Use: 0.0 oz/week    0 Standard drinks or equivalent per week     Comment: 4-5 drinks in a day; 2 times per week  . Drug Use: None  . Sexual Activity: Not Asked   Other Topics Concern  . 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  fluticasone (FLONASE) 50 MCG/ACT nasal spray, Place 2 sprays into both nostrils daily., Disp: 16 g, Rfl: 1  EXAM:  Filed Vitals:   05/28/15 1023  BP: 100/70  Pulse: 68  Temp: 98.5 F (36.9 C)    There is no weight on file to calculate BMI.  GENERAL: vitals reviewed and listed above, alert, oriented, appears well hydrated and in no acute distress  HEENT: atraumatic, conjunttiva clear, no obvious abnormalities on inspection of external nose and ears, normal appearance  of ear canals and TMs except for clear effusion on the right, clear nasal congestion, pale and boggy turbinates, mild post oropharyngeal erythema with PND and cobblestoning, no tonsillar edema or exudate, no sinus TTP  NECK: no obvious masses on inspection  LUNGS: clear to auscultation bilaterally, no wheezes, rales or rhonchi, good air movement  CV: HRRR, no peripheral edema  MS: moves all extremities without noticeable abnormality  PSYCH: pleasant and cooperative, no obvious depression or anxiety  ASSESSMENT AND PLAN:  Discussed the following assessment and plan:  Rhinosinusitis  -given HPI and exam findings today, a serious infection or illness is unlikely. We discussed potential etiologies, with allergic rhinitis being most likely. We discussed treatment side effects, likely course, transmission, and signs of developing a serious illness. Advised start a short course of nasal decongestant, intranasal steroid and an oral antihistamine. She is to follow up if symptoms worsen or if they persist despite treatment. She is due for a physical and has been advised to schedule this as she was leaving today.    Patient Instructions  Before you leave: -Schedule your physical in 3 months, please come fasting and we will plan to do lab work that day- you may drink water and black coffee  Please start the following medications: -  Afrin nasal spray, available over-the-counter, twice daily for 3 days then stop -Flonase 2 sprays each nostril daily for 21 days, then 1 spray each nostril daily if needed -Zyrtec or Allegra once daily, this is available over-the-counter Please follow up if your symptoms worsen or persist despite these treatments.  Please check with your insurance company and the cost of the various Colon Cancer screening options including:  cologuard and colonosocpy     Brysyn Brandenberger R.

## 2015-07-01 ENCOUNTER — Ambulatory Visit (INDEPENDENT_AMBULATORY_CARE_PROVIDER_SITE_OTHER): Payer: BC Managed Care – PPO | Admitting: Family Medicine

## 2015-07-01 ENCOUNTER — Encounter: Payer: Self-pay | Admitting: Family Medicine

## 2015-07-01 VITALS — BP 102/80 | HR 84 | Temp 98.4°F | Ht 65.25 in | Wt 158.4 lb

## 2015-07-01 DIAGNOSIS — Z720 Tobacco use: Secondary | ICD-10-CM

## 2015-07-01 DIAGNOSIS — F329 Major depressive disorder, single episode, unspecified: Secondary | ICD-10-CM

## 2015-07-01 DIAGNOSIS — F17200 Nicotine dependence, unspecified, uncomplicated: Secondary | ICD-10-CM

## 2015-07-01 DIAGNOSIS — F32A Depression, unspecified: Secondary | ICD-10-CM

## 2015-07-01 MED ORDER — BUPROPION HCL ER (SR) 150 MG PO TB12: 150.0000 mg | ORAL_TABLET | Freq: Two times a day (BID) | ORAL | Status: DC

## 2015-07-01 NOTE — Progress Notes (Signed)
HPI:  Isabella Cannon is a very pleasant 55 yo here for an acute visit for new depression:  Depression Symptoms: Started 1 year ago with death of father Sleep disorder: yes, wakes up at 3:30 each morning Interest deficit/anhedonia: yes, daily Guilt (worthlessness, hopelessness, regret): yes, daily Energy deficit: yes, daily Concentration deficit: yes, frequent Appetite disorder: yes, feels uses food for comfort Psychomotor retardation or agitation: sluggish Suicidality: no No anxiety, panic, SI, thoughts of self harm, manic symptoms or prior depression Brother on wellbutrin and is doing great She has started smoking a little, motivated to quit, wants to quit  ROS: See pertinent positives and negatives per HPI.  Past Medical History  Diagnosis Date  . Allergy   . Endometriosis     No past surgical history on file.  Family History  Problem Relation Age of Onset  . COPD Father   . Heart disease Father     chf, MI  . Hyperlipidemia Father   . Hypertension Father   . Arthritis Sister     Social History   Social History  . Marital Status: Single    Spouse Name: N/A  . Number of Children: N/A  . Years of Education: N/A   Social History Main Topics  . Smoking status: Current Some Day Smoker    Last Attempt to Quit: 12/05/2014  . Smokeless tobacco: None  . Alcohol Use: 0.0 oz/week    0 Standard drinks or equivalent per week     Comment: 4-5 drinks in a day; 2 times per week  . Drug Use: None  . Sexual Activity: Not Asked   Other Topics Concern  . 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  fluticasone (FLONASE) 50 MCG/ACT nasal spray, Place 2 sprays into both nostrils daily., Disp: 16 g, Rfl: 1 .  buPROPion (WELLBUTRIN SR) 150 MG  12 hr tablet, Take 1 tablet (150 mg total) by mouth 2 (two) times daily., Disp: 60 tablet, Rfl: 2  EXAM:  Filed Vitals:   07/01/15 0810  BP: 102/80  Pulse: 84  Temp: 98.4 F (36.9 C)    Body mass index is 26.17 kg/(m^2).  GENERAL: vitals reviewed and listed above, alert, oriented, appears well hydrated and in no acute distress  HEENT: atraumatic, conjunttiva clear, no obvious abnormalities on inspection of external nose and ears  NECK: no obvious masses on inspection  LUNGS: clear to auscultation bilaterally, no wheezes, rales or rhonchi, good air movement  CV: HRRR, no peripheral edema  MS: moves all extremities without noticeable abnormality  PSYCH: pleasant and cooperative, no obvious depression or anxiety  ASSESSMENT AND PLAN:  Discussed the following assessment and plan:  Depression  Smoking  -counseled and supported on difficult time, depression - no severe symptoms -discussed treatment options -she wishes to try Wellbutrin, possibly CBT -smoking cessation counseling 3-5 minutes, assess motivation, risks, reasons to quit, risks of treatment -Patient advised to return or notify a doctor immediately if symptoms worsen or persist or new concerns arise.  Patient Instructions  BEFORE YOU LEAVE: -schedule follow up in 1 month  Start the Wellbutrin - once daily for 3 days, then twice daily.  Consider counseling with doctor bray.  Quit smoking in 1 week if you can.  Call if any concerns, questions or worsening.     Colin Benton R.

## 2015-07-01 NOTE — Progress Notes (Signed)
Pre visit review using our clinic review tool, if applicable. No additional management support is needed unless otherwise documented below in the visit note. 

## 2015-07-01 NOTE — Patient Instructions (Signed)
BEFORE YOU LEAVE: -schedule follow up in 1 month  Start the Wellbutrin - once daily for 3 days, then twice daily.  Consider counseling with doctor bray.  Quit smoking in 1 week if you can.  Call if any concerns, questions or worsening.

## 2015-07-29 ENCOUNTER — Ambulatory Visit: Payer: Self-pay | Admitting: Family Medicine

## 2015-07-29 DIAGNOSIS — Z0289 Encounter for other administrative examinations: Secondary | ICD-10-CM

## 2015-07-29 NOTE — Progress Notes (Signed)
NO SHOW for follow up.   On review of chart prior to appointment: Depression Symptoms: Depression started in spring 2016 with death of father -started wellbutrin 06/2015 From last visit: Sleep disorder: yes, wakes up at 3:30 each morning Interest deficit/anhedonia: yes, daily Guilt (worthlessness, hopelessness, regret): yes, daily Energy deficit: yes, daily Concentration deficit: yes, frequent Appetite disorder: yes, feels uses food for comfort Psychomotor retardation or agitation: sluggish Suicidality: no No anxiety, panic, SI, thoughts of self harm, manic symptoms or prior depression Brother on wellbutrin and is doing great  Tobacco use: She had started smoking a little -motivated to quit 06/2015, started wellbutrin, counseled, quitline info provided  Assistant to contact to reschedule per no show protocol.

## 2015-08-27 ENCOUNTER — Encounter: Payer: Self-pay | Admitting: Family Medicine

## 2015-08-28 ENCOUNTER — Ambulatory Visit (INDEPENDENT_AMBULATORY_CARE_PROVIDER_SITE_OTHER): Payer: BC Managed Care – PPO | Admitting: Family Medicine

## 2015-08-28 ENCOUNTER — Encounter: Payer: Self-pay | Admitting: Family Medicine

## 2015-08-28 ENCOUNTER — Encounter: Payer: Self-pay | Admitting: Internal Medicine

## 2015-08-28 VITALS — BP 100/80 | HR 83 | Temp 98.7°F | Ht 64.5 in | Wt 158.0 lb

## 2015-08-28 DIAGNOSIS — K219 Gastro-esophageal reflux disease without esophagitis: Secondary | ICD-10-CM

## 2015-08-28 DIAGNOSIS — R911 Solitary pulmonary nodule: Secondary | ICD-10-CM | POA: Diagnosis not present

## 2015-08-28 DIAGNOSIS — Z1211 Encounter for screening for malignant neoplasm of colon: Secondary | ICD-10-CM | POA: Diagnosis not present

## 2015-08-28 DIAGNOSIS — Z0000 Encounter for general adult medical examination without abnormal findings: Secondary | ICD-10-CM | POA: Diagnosis not present

## 2015-08-28 DIAGNOSIS — F325 Major depressive disorder, single episode, in full remission: Secondary | ICD-10-CM | POA: Diagnosis not present

## 2015-08-28 DIAGNOSIS — F17200 Nicotine dependence, unspecified, uncomplicated: Secondary | ICD-10-CM

## 2015-08-28 LAB — CBC WITH DIFFERENTIAL/PLATELET
BASOS PCT: 1.3 % (ref 0.0–3.0)
Basophils Absolute: 0.1 10*3/uL (ref 0.0–0.1)
EOS ABS: 0.1 10*3/uL (ref 0.0–0.7)
Eosinophils Relative: 2.4 % (ref 0.0–5.0)
HEMATOCRIT: 40 % (ref 36.0–46.0)
HEMOGLOBIN: 13.6 g/dL (ref 12.0–15.0)
LYMPHS PCT: 31.5 % (ref 12.0–46.0)
Lymphs Abs: 1.6 10*3/uL (ref 0.7–4.0)
MCHC: 33.8 g/dL (ref 30.0–36.0)
MCV: 94.1 fl (ref 78.0–100.0)
MONOS PCT: 11.9 % (ref 3.0–12.0)
Monocytes Absolute: 0.6 10*3/uL (ref 0.1–1.0)
Neutro Abs: 2.8 10*3/uL (ref 1.4–7.7)
Neutrophils Relative %: 52.9 % (ref 43.0–77.0)
Platelets: 331 10*3/uL (ref 150.0–400.0)
RBC: 4.25 Mil/uL (ref 3.87–5.11)
RDW: 13.4 % (ref 11.5–15.5)
WBC: 5.2 10*3/uL (ref 4.0–10.5)

## 2015-08-28 LAB — LIPID PANEL
CHOL/HDL RATIO: 5
Cholesterol: 254 mg/dL — ABNORMAL HIGH (ref 0–200)
HDL: 54.6 mg/dL (ref >39.00)
LDL Cholesterol: 184 mg/dL — ABNORMAL HIGH (ref 0–99)
NONHDL: 199.18
Triglycerides: 76 mg/dL (ref 0.0–149.0)
VLDL: 15.2 mg/dL (ref 0.0–40.0)

## 2015-08-28 LAB — BASIC METABOLIC PANEL
BUN: 23 mg/dL (ref 6–23)
CALCIUM: 9.4 mg/dL (ref 8.4–10.5)
CHLORIDE: 107 meq/L (ref 96–112)
CO2: 28 mEq/L (ref 19–32)
CREATININE: 0.92 mg/dL (ref 0.40–1.20)
GFR: 67.2 mL/min (ref >60.00)
Glucose, Bld: 90 mg/dL (ref 70–99)
Potassium: 4.7 mEq/L (ref 3.5–5.1)
SODIUM: 140 meq/L (ref 135–145)

## 2015-08-28 LAB — HEMOGLOBIN A1C: Hgb A1c MFr Bld: 5.9 % (ref 4.6–6.5)

## 2015-08-28 MED ORDER — BUPROPION HCL ER (SR) 150 MG PO TB12: 150.0000 mg | ORAL_TABLET | Freq: Two times a day (BID) | ORAL | Status: DC

## 2015-08-28 MED ORDER — PRAVASTATIN SODIUM 20 MG PO TABS: 20.0000 mg | ORAL_TABLET | Freq: Every day | ORAL | Status: DC

## 2015-08-28 NOTE — Progress Notes (Signed)
Pre visit review using our clinic review tool, if applicable. No additional management support is needed unless otherwise documented below in the visit note. 

## 2015-08-28 NOTE — Addendum Note (Signed)
Addended by: Agnes Lawrence on: 08/28/2015 06:13 PM   Modules accepted: Orders

## 2015-08-28 NOTE — Progress Notes (Signed)
HPI:  .Isabella Cannon is a 55 yo smoker with a history of poor compliance with recommendations, follow up and preventive care advise, here for CPE:  -Concerns and/or follow up today:   Depression: Depression started in spring 2016 with death of father -started wellbutrin 06/2015 and now feels much improved, very mildly depressed mood rarely now - reports this was a "miracle" -symptoms initially: poor sleep, anhedonia, hopelessness, energy deficit, poor concentration, sluggish Brother on wellbutrin and is doing great  Tobacco use: -smoking 2 cigarettes per day, this is part of her evening and morning routine -seems motivation to give up last few is low -she fees the wellbutrin has helped her to cut back -she feels 2 per day is better then the 1/3 ppd she was doing  Hx pulm nodules: -we advised repeat CT scan a number of times -reports: she didn't do the ct -denies: SOB, chronic cough, hemoptysis, weight loss  Hyperlipidemia: -some walking, diet ok, lots of simple carbs, chips, etc  Occ GERD: -burping, epigastric discomfort after eating -no change in bowels, NVD, hematochezia, melena, weight loss, dysphagia  -Diet: variety of foods, balance and well rounded  -Exercise: walking 1-2 miles most days if not raining  -Taking folic acid, vitamin D or calcium: no  -Diabetes and Dyslipidemia Screening: FASTING  -Hx of HTN: no  -Vaccines: Tdap - she thinks she had this  -pap history: normal 09/2013  -FDLMP: n/a  -sexual activity: yes, female partner, no new partners  -wants STI testing (Hep C if born [birth year]): no  -FH breast, colon or ovarian ca: see FH Last mammogram: she agrees to call to set up  Last colon cancer screening: agrees to do colonoscopy after discussion options, referral placed  Breast Ca Risk Assessment: No sig family or personal hx  -Alcohol, Tobacco, drug use: see social history  Review of Systems - no fevers, unintentional weight loss, vision loss,  hearing loss, chest pain, sob, hemoptysis, melena, hematochezia, hematuria, genital discharge, changing or concerning skin lesions, bleeding, bruising, loc, thoughts of self harm or SI  Past Medical History  Diagnosis Date  . Allergy   . Endometriosis     No past surgical history on file.  Family History  Problem Relation Age of Onset  . COPD Father   . Heart disease Father     chf, MI  . Hyperlipidemia Father   . Hypertension Father   . Arthritis Sister     Social History   Social History  . Marital Status: Single    Spouse Name: N/A  . Number of Children: N/A  . Years of Education: N/A   Social History Main Topics  . Smoking status: Current Some Day Smoker    Last Attempt to Quit: 12/05/2014  . Smokeless tobacco: None  . Alcohol Use: 0.0 oz/week    0 Standard drinks or equivalent per week     Comment: 4-5 drinks in a day; 2 times per week  . Drug Use: None  . Sexual Activity: Not Asked   Other Topics Concern  . 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  buPROPion (WELLBUTRIN SR) 150 MG 12 hr tablet, Take  1 tablet (150 mg total) by mouth 2 (two) times daily., Disp: 60 tablet, Rfl: 3  EXAM:  Filed Vitals:   08/28/15 0702  BP: 100/80  Pulse: 83  Temp: 98.7 F (37.1 C)    GENERAL: vitals reviewed and listed below, alert, oriented, appears well hydrated and in no acute distress  HEENT: head atraumatic, PERRLA, normal appearance of eyes, ears, nose and mouth. moist mucus membranes.  NECK: supple, no masses or lymphadenopathy  LUNGS: clear to auscultation bilaterally, no rales, rhonchi or wheeze  CV: HRRR, no peripheral edema or cyanosis, normal pedal pulses  BREAST: refused  ABDOMEN: bowel sounds normal, soft, non  tender to palpation, no masses, no rebound or guarding  GU: refused  SKIN: no rash or abnormal lesions, she refused to get undressed for full skin exam  MS: normal gait, moves all extremities normally  NEURO: CN II-XII grossly intact, normal muscle strength and sensation to light touch on extremities  PSYCH: normal affect, pleasant and cooperative  ASSESSMENT AND PLAN:  Discussed the following assessment and plan:  Visit for preventive health examination - Plan: Lipid Panel, Basic metabolic panel, CBC with Differential, Hemoglobin A1c  Pulmonary nodule - Plan: CT CHEST NODULE FOLLOW UP LOW DOSE W/O  Colon cancer screening - Plan: Ambulatory referral to Gastroenterology  Major depressive disorder with single episode, in full remission ([place])  Gastroesophageal reflux disease, esophagitis presence not specified  Tobacco use disorder  -stress importance of following up on all of the tests and exams we advised  -advised to quit smoking and counseled 3-5 minutes  -advised assistant to call pt in 4 weeks to ensure she has set up/done all ordered exams and screenings  -Discussed and advised all [place] preventive services health task force level A and B recommendations for age, sex and risks.  -Advised at least 150 minutes of exercise per week and a healthy diet low in saturated fats and sweets and consisting of fresh fruits and vegetables, lean meats such as fish and white chicken and whole grains.  - -FASTING labs, studies and vaccines per orders this encounter  Orders Placed This Encounter  Procedures  . CT CHEST NODULE FOLLOW UP LOW DOSE W/O    Standing Status: Future     Number of Occurrences:      Standing Expiration Date: 10/27/2016    Order Specific Question:  Reason for Exam (SYMPTOM  OR DIAGNOSIS REQUIRED)    Answer:  f/u pulm nodule    Order Specific Question:  Is the patient pregnant?    Answer:  No    Order Specific Question:  Preferred imaging location?    Answer:   [facility]  . Lipid Panel  . Basic metabolic panel  . CBC with Differential  . Hemoglobin A1c  . Ambulatory referral to Gastroenterology    Referral Priority:  Routine    Referral Type:  Consultation    Referral Reason:  Specialty Services Required    Number of Visits Requested:  1    Patient advised to return to clinic immediately if symptoms worsen or persist or new concerns.  Patient Instructions  BEFORE YOU LEAVE: -lab -follow up in 4-6 months  Vit D3 [phone] IU daily  Please quit smoking  Please see a dentist every 6-12 months  Prilosec once daily to see if this helps the acid. If not resolved in 2-3 weeks, please schedule follow up  Please get your record for your last tetanus shot  Please get colonoscopy to screen  for colon cancer.  Call today to set your mammogram  Get the CT scan for the nodules in the lungs. We placed a referral for you as discussed for the CT scan for the lungs. It usually takes about 1-2 weeks to process and schedule this referral. If you have not heard from [place] regarding this appointment in 2 weeks please contact our office.  We recommend the following healthy lifestyle measures: - eat a healthy whole foods diet consisting of regular small meals composed of vegetables, fruits, beans, nuts, seeds, healthy meats such as white chicken and fish and whole grains.  - avoid sweets, white starchy foods, fried foods, fast food, processed foods, sodas, red meet and other fattening foods.  - get a least 150-300 minutes of aerobic exercise per week.   We have ordered labs or studies at this visit. It can take up to 1-2 weeks for results and processing. IF results require follow up or explanation, we will call you with instructions. Clinically stable results will be released to your [facility]. If you have not heard from [place] or cannot find your results in [facility] in 2 weeks please contact our office at [phone].  If you are not yet signed up for  [facility], please consider signing up.            No Follow-up on file.  Colin Benton R.

## 2015-08-28 NOTE — Patient Instructions (Addendum)
BEFORE YOU LEAVE: -lab -follow up in 4-6 months  Vit D3 [phone] IU daily  Please quit smoking  Please see a dentist every 6-12 months  Prilosec once daily to see if this helps the acid. If not resolved in 2-3 weeks, please schedule follow up  Please get your record for your last tetanus shot  Please get colonoscopy to screen for colon cancer.  Call today to set your mammogram  Get the CT scan for the nodules in the lungs. We placed a referral for you as discussed for the CT scan for the lungs. It usually takes about 1-2 weeks to process and schedule this referral. If you have not heard from [place] regarding this appointment in 2 weeks please contact our office.  We recommend the following healthy lifestyle measures: - eat a healthy whole foods diet consisting of regular small meals composed of vegetables, fruits, beans, nuts, seeds, healthy meats such as white chicken and fish and whole grains.  - avoid sweets, white starchy foods, fried foods, fast food, processed foods, sodas, red meet and other fattening foods.  - get a least 150-300 minutes of aerobic exercise per week.   We have ordered labs or studies at this visit. It can take up to 1-2 weeks for results and processing. IF results require follow up or explanation, we will call you with instructions. Clinically stable results will be released to your [facility]. If you have not heard from [place] or cannot find your results in [facility] in 2 weeks please contact our office at [phone].  If you are not yet signed up for [facility], please consider signing up.

## 2015-09-03 ENCOUNTER — Telehealth: Payer: Self-pay | Admitting: Family Medicine

## 2015-09-03 NOTE — Telephone Encounter (Signed)
Isabella Cannon needs order to be changed to lung screening program  With follow up on nodule. Isabella Cannon also need to know  how many month does pt needs nodule follow.

## 2015-09-03 NOTE — Telephone Encounter (Signed)
Dr. Maudie Mercury please see message

## 2015-09-04 ENCOUNTER — Telehealth: Payer: Self-pay | Admitting: Adult Health

## 2015-09-04 NOTE — Telephone Encounter (Signed)
I called [facility] and spoke with Christie Beckers and she stated Estill Bamberg is at lunch and she will have her call me back.

## 2015-09-04 NOTE — Telephone Encounter (Signed)
I believe she missed a follow up CT for pulm nodules so needs ASAP. I am not sure what is being requested, but ok to change order if radiology is requesting. Thanks.

## 2015-09-05 NOTE — Telephone Encounter (Signed)
Spoke with Arville Go from Dr. Julianne Rice office. She states that Dr. Maudie Mercury only wants the pt to have a low dose CT for lung nodules. She states that she does not want her enrolled in the lung cancer screening program. I explained to her that I would discuss this with SG. She voiced understanding and had no further questions.

## 2015-09-05 NOTE — Telephone Encounter (Signed)
I called Isabella Cannon back and informed her of the message below and she stated she will check with Judson Roch to see if the order for the CT is what is needed and will call back if necessary.

## 2015-09-11 NOTE — Telephone Encounter (Signed)
SG please advise on below note.  Thanks!

## 2015-09-15 NOTE — Telephone Encounter (Signed)
Dr. Julianne Rice office  will have to order and precert the scan themselves . The program is for annual screening, and we only manage the patients in the program.We will not be involved with this patients care if they are not in the program Thanks.

## 2015-09-16 NOTE — Telephone Encounter (Signed)
Spoke with [facility] @ Dr Julianne Rice. Advised her that they can continue with present order for CT since Dr Maudie Mercury does not want pt enrolled in [place]. Nothing further needed.

## 2015-09-23 ENCOUNTER — Other Ambulatory Visit: Payer: Self-pay | Admitting: Family Medicine

## 2015-10-13 ENCOUNTER — Ambulatory Visit (AMBULATORY_SURGERY_CENTER): Payer: Self-pay | Admitting: *Deleted

## 2015-10-13 ENCOUNTER — Encounter: Payer: Self-pay | Admitting: Internal Medicine

## 2015-10-13 VITALS — Ht 64.0 in | Wt 160.6 lb

## 2015-10-13 DIAGNOSIS — Z1211 Encounter for screening for malignant neoplasm of colon: Secondary | ICD-10-CM

## 2015-10-13 MED ORDER — NA SULFATE-K SULFATE-MG SULF 17.5-3.13-1.6 GM/177ML PO SOLN: ORAL | Status: DC

## 2015-10-13 NOTE — Progress Notes (Signed)
No egg or soy allergy  No anesthesia or intubation problems per pt  No diet medications taken  No home oxygen used   

## 2015-10-27 ENCOUNTER — Encounter: Payer: Self-pay | Admitting: Internal Medicine

## 2015-10-27 ENCOUNTER — Ambulatory Visit (AMBULATORY_SURGERY_CENTER): Payer: BC Managed Care – PPO | Admitting: Internal Medicine

## 2015-10-27 VITALS — BP 115/71 | HR 73 | Temp 97.8°F | Resp 13 | Ht 64.0 in | Wt 160.0 lb

## 2015-10-27 DIAGNOSIS — K621 Rectal polyp: Secondary | ICD-10-CM | POA: Diagnosis not present

## 2015-10-27 DIAGNOSIS — D122 Benign neoplasm of ascending colon: Secondary | ICD-10-CM | POA: Diagnosis not present

## 2015-10-27 DIAGNOSIS — D125 Benign neoplasm of sigmoid colon: Secondary | ICD-10-CM

## 2015-10-27 DIAGNOSIS — D129 Benign neoplasm of anus and anal canal: Secondary | ICD-10-CM

## 2015-10-27 DIAGNOSIS — Z1211 Encounter for screening for malignant neoplasm of colon: Secondary | ICD-10-CM

## 2015-10-27 DIAGNOSIS — D128 Benign neoplasm of rectum: Secondary | ICD-10-CM

## 2015-10-27 NOTE — Patient Instructions (Signed)
YOU HAD AN ENDOSCOPIC PROCEDURE TODAY AT [facility]:   Refer to the procedure report that was given to you for any specific questions about what was found during the examination.  If the procedure report does not answer your questions, please call your gastroenterologist to clarify.  If you requested that your care partner not be given the details of your procedure findings, then the procedure report has been included in a sealed envelope for you to review at your convenience later.  YOU SHOULD EXPECT: Some feelings of bloating in the abdomen. Passage of more gas than usual.  Walking can help get rid of the air that was put into your GI tract during the procedure and reduce the bloating. If you had a lower endoscopy (such as a colonoscopy or flexible sigmoidoscopy) you may notice spotting of blood in your stool or on the toilet paper. If you underwent a bowel prep for your procedure, you may not have a normal bowel movement for a few days.  Please Note:  You might notice some irritation and congestion in your nose or some drainage.  This is from the oxygen used during your procedure.  There is no need for concern and it should clear up in a day or so.  SYMPTOMS TO REPORT IMMEDIATELY:   Following lower endoscopy (colonoscopy or flexible sigmoidoscopy):  Excessive amounts of blood in the stool  Significant tenderness or worsening of abdominal pains  Swelling of the abdomen that is new, acute  Fever of 100F or higher  For urgent or emergent issues, a gastroenterologist can be reached at any hour by calling [phone].   DIET: Your first meal following the procedure should be a small meal and then it is ok to progress to your normal diet. Heavy or fried foods are harder to digest and may make you feel nauseous or bloated.  Likewise, meals heavy in dairy and vegetables can increase bloating.  Drink plenty of fluids but you should avoid alcoholic beverages for 24  hours.  ACTIVITY:  You should plan to take it easy for the rest of today and you should NOT DRIVE or use heavy machinery until tomorrow (because of the sedation medicines used during the test).    FOLLOW UP: Our staff will call the number listed on your records the next business day following your procedure to check on you and address any questions or concerns that you may have regarding the information given to you following your procedure. If we do not reach you, we will leave a message.  However, if you are feeling well and you are not experiencing any problems, there is no need to return our call.  We will assume that you have returned to your regular daily activities without incident.  If any biopsies were taken you will be contacted by phone or by letter within the next 1-3 weeks.  Please call us at [phone] if you have not heard about the biopsies in 3 weeks.    SIGNATURES/CONFIDENTIALITY: You and/or your care partner have signed paperwork which will be entered into your electronic medical record.  These signatures attest to the fact that that the information above on your After Visit Summary has been reviewed and is understood.  Full responsibility of the confidentiality of this discharge information lies with you and/or your care-partner.  Please read polyp, diverticulosis, and high fiber handouts provided.

## 2015-10-27 NOTE — Progress Notes (Signed)
Report given to PACU RN, vss 

## 2015-10-27 NOTE — Op Note (Signed)
[facility] Patient Name: Isabella Cannon Procedure Date: 10/27/2015 8:46 AM MRN: [id number] Endoscopist: Docia Chuck. Henrene Pastor , MD Age: 55 Referring MD:  Date of Birth: 1960-09-24 Gender: Female Account #: [id number] Procedure:                Colonoscopy, with snare polypectomy X4 Indications:              Screening for colorectal malignant neoplasm Medicines:                Monitored Anesthesia Care Procedure:                Pre-Anesthesia Assessment:                           - Prior to the procedure, a History and Physical                            was performed, and patient medications and                            allergies were reviewed. The patient's tolerance of                            previous anesthesia was also reviewed. The risks                            and benefits of the procedure and the sedation                            options and risks were discussed with the patient.                            All questions were answered, and informed consent                            was obtained. Prior Anticoagulants: The patient has                            taken no previous anticoagulant or antiplatelet                            agents. ASA Grade Assessment: II - A patient with                            mild systemic disease. After reviewing the risks                            and benefits, the patient was deemed in                            satisfactory condition to undergo the procedure.                           After obtaining informed consent, the colonoscope  was passed under direct vision. Throughout the                            procedure, the patient's blood pressure, pulse, and                            oxygen saturations were monitored continuously. The                            Model CF-HQ190L [id number]) scope was introduced                            through the anus and advanced to the the cecum,        identified by appendiceal orifice and ileocecal                            valve. The ileocecal valve, appendiceal orifice,                            and rectum were photographed. The quality of the                            bowel preparation was excellent. The colonoscopy                            was performed without difficulty. The patient                            tolerated the procedure well. The bowel preparation                            used was SUPREP. Scope In: 8:54:41 AM Scope Out: 9:14:38 AM Scope Withdrawal Time: 0 hours 17 minutes 26 seconds  Total Procedure Duration: 0 hours 19 minutes 57 seconds  Findings:                 Four sessile polyps were found in the rectum,                            sigmoid colon and ascending colon. The polyps were                            3 to 10 mm in size. These polyps were removed with                            a cold snare. Resection and retrieval were complete.                           Multiple diverticula were found in the sigmoid                            colon.  Internal hemorrhoids were found during retroflexion.                           The exam was otherwise without abnormality on                            direct and retroflexion views. Complications:            No immediate complications. Estimated blood loss:                            None. Estimated Blood Loss:     Estimated blood loss: none. Impression:               - Four 3 to 10 mm polyps in the rectum, in the                            sigmoid colon and in the ascending colon, removed                            with a cold snare. Resected and retrieved.                           - Diverticulosis in the sigmoid colon.                           - Internal hemorrhoids.                           - The examination was otherwise normal on direct                            and retroflexion views. Recommendation:           - Repeat  colonoscopy in 3 years for surveillance.                           - Patient has a contact number available for                            emergencies. The signs and symptoms of potential                            delayed complications were discussed with the                            patient. Return to normal activities tomorrow.                            Written discharge instructions were provided to the                            patient.                           - Resume previous diet.                           -  Continue present medications.                           - Await pathology results. Docia Chuck. Henrene Pastor, MD 10/27/2015 9:22:05 AM This report has been signed electronically.

## 2015-10-27 NOTE — Progress Notes (Signed)
Called to room to assist during endoscopic procedure.  Patient ID and intended procedure confirmed with present staff. Received instructions for my participation in the procedure from the performing physician.  

## 2015-10-28 ENCOUNTER — Telehealth: Payer: Self-pay | Admitting: *Deleted

## 2015-10-28 NOTE — Telephone Encounter (Signed)
Number identifier, left message, follow-up  

## 2015-10-31 ENCOUNTER — Encounter: Payer: Self-pay | Admitting: Internal Medicine

## 2016-01-02 ENCOUNTER — Ambulatory Visit: Payer: Self-pay | Admitting: Family Medicine

## 2016-01-30 ENCOUNTER — Other Ambulatory Visit: Payer: Self-pay | Admitting: Family Medicine

## 2016-02-01 ENCOUNTER — Other Ambulatory Visit: Payer: Self-pay | Admitting: Family Medicine

## 2016-03-12 ENCOUNTER — Other Ambulatory Visit: Payer: Self-pay | Admitting: Family Medicine

## 2016-04-13 ENCOUNTER — Other Ambulatory Visit: Payer: Self-pay | Admitting: Family Medicine

## 2016-06-03 ENCOUNTER — Other Ambulatory Visit: Payer: Self-pay | Admitting: Family Medicine

## 2016-06-12 ENCOUNTER — Other Ambulatory Visit: Payer: Self-pay | Admitting: Family Medicine

## 2016-07-15 ENCOUNTER — Other Ambulatory Visit: Payer: Self-pay | Admitting: Family Medicine

## 2016-08-03 ENCOUNTER — Other Ambulatory Visit: Payer: Self-pay | Admitting: Family Medicine

## 2016-08-15 ENCOUNTER — Other Ambulatory Visit: Payer: Self-pay | Admitting: Family Medicine

## 2016-09-03 ENCOUNTER — Other Ambulatory Visit: Payer: Self-pay | Admitting: Family Medicine

## 2016-09-20 ENCOUNTER — Other Ambulatory Visit: Payer: Self-pay | Admitting: Family Medicine

## 2016-09-21 NOTE — Telephone Encounter (Signed)
Called patient to schedule an appointment before refilling Wellbutrin 150 Rx, left a message for patient to return my call in the office.

## 2016-10-04 ENCOUNTER — Other Ambulatory Visit: Payer: Self-pay | Admitting: Family Medicine

## 2016-10-21 ENCOUNTER — Encounter: Payer: Self-pay | Admitting: Family Medicine

## 2016-10-21 ENCOUNTER — Other Ambulatory Visit: Payer: Self-pay | Admitting: *Deleted

## 2016-10-21 ENCOUNTER — Ambulatory Visit (INDEPENDENT_AMBULATORY_CARE_PROVIDER_SITE_OTHER): Payer: BC Managed Care – PPO | Admitting: Family Medicine

## 2016-10-21 VITALS — BP 90/60 | HR 88 | Temp 98.2°F | Ht 64.0 in | Wt 160.3 lb

## 2016-10-21 DIAGNOSIS — F325 Major depressive disorder, single episode, in full remission: Secondary | ICD-10-CM

## 2016-10-21 DIAGNOSIS — Z1389 Encounter for screening for other disorder: Secondary | ICD-10-CM

## 2016-10-21 DIAGNOSIS — Z1331 Encounter for screening for depression: Secondary | ICD-10-CM

## 2016-10-21 DIAGNOSIS — E785 Hyperlipidemia, unspecified: Secondary | ICD-10-CM

## 2016-10-21 DIAGNOSIS — R911 Solitary pulmonary nodule: Secondary | ICD-10-CM | POA: Diagnosis not present

## 2016-10-21 MED ORDER — BUPROPION HCL ER (SR) 150 MG PO TB12: 150.0000 mg | ORAL_TABLET | Freq: Two times a day (BID) | ORAL | 0 refills | Status: AC

## 2016-10-21 NOTE — Patient Instructions (Signed)
BEFORE YOU LEAVE: -Isabella Cannon - schedule CT chest for follow up pulmonary nodules (not lung cancer screening) -schedule her a physical in next 1 month - plan labs then  We again advise a repeat CT scan of the chest for your history of pulmonary nodules and smoking. Placed this referral again today. If you have not heard about this appointment in 1-2 weeks please call our office.  Please restart the wellburtin.  Cut back on smoking.   We recommend the following healthy lifestyle for LIFE: 1) Small portions.   Tip: eat off of a salad plate instead of a dinner plate.  Tip: It is ok to feel hungry after a meal - that likely means you ate an appropriate portion.  Tip: if you need more or a snack choose fruits, veggies and/or a handful of nuts or seeds.  2) Eat a healthy clean diet.   TRY TO EAT: -at least 5-7 servings of low sugar vegetables per day (not corn, potatoes or bananas.) -berries are the best choice if you wish to eat fruit.   -lean meets (fish, chicken or [place] breasts) -vegan proteins for some meals - beans or tofu, whole grains, nuts and seeds -Replace bad fats with good fats - good fats include: fish, nuts and seeds, canola oil, olive oil -small amounts of low fat or non fat dairy -small amounts of100 % whole grains - check the lables  AVOID: -SUGAR, sweets, anything with added sugar, corn syrup or sweeteners -if you must have a sweetener, small amounts of stevia may be best -sweetened beverages -simple starches (rice, bread, potatoes, pasta, chips, etc - small amounts of 100% whole grains are ok) -red meat, pork, butter -fried foods, fast food, processed food, excessive dairy, eggs and coconut.  3)Get at least 150 minutes of sweaty aerobic exercise per week.  4)Reduce stress - consider counseling, meditation and relaxation to balance other aspects of your life.

## 2016-10-21 NOTE — Progress Notes (Signed)
HPI:  Isabella Cannon is a 56 yo with a PMH tobacco use, depression, pulm nodules and poor compliance with recommended care here for follow up. Sees summary of medical issues below - updated today. She has not been here in over 1 year. Due for labs, CT lungs, preventive care visit.  Depression: Depression started in spring 2016 with death of father -started wellbutrin 06/2015, this was a "miracle" -ran out of Wellbutrin 1 month ago, requests refill -depressed mood daily mild -denies: thoughts of harm to self or other, hallucinations, cog fog, sleep issues Brother on wellbutrin and is doing great  Tobacco use: -smoking 3-4 cigarettes per day, this is part of her evening and morning routine -she feels the wellbutrin had helped her to cut back -used to smoke 2-1/3 ppd  Hx pulm nodules: -we advised repeat CT scan in the past for hx of pulm nodules prior to establishing with [place] -reports: this is "news to me" and " I don't have pulmonary nodules" when asked about this today -on review of chart we spoke with her about this on several occasion and reordered CT several times in the past - this is in the patient instructions from her last visit here and we spoke with her about it and gave written instructions -denies: SOB, chronic cough, hemoptysis, weight loss  Hyperlipidemia: -some walking, diet ok, lots of simple carbs, chips, etc -advised statin  Occ GERD: -burping, epigastric discomfort after eating -no change in bowels, NVD, hematochezia, melena, weight loss, dysphagia   ROS: See pertinent positives and negatives per HPI.  Past Medical History:  Diagnosis Date  . Allergy   . Depression   . Endometriosis   . Hyperlipidemia     Past Surgical History:  Procedure Laterality Date  . endometrial surgery      Family History  Problem Relation Age of Onset  . COPD Father   . Heart disease Father        chf, MI  . Hyperlipidemia Father   . Hypertension Father   . Arthritis  Sister   . Colon cancer Neg Hx   . Esophageal cancer Neg Hx   . Rectal cancer Neg Hx   . Stomach cancer Neg Hx     Social History   Social History  . Marital status: Single    Spouse name: N/A  . Number of children: N/A  . Years of education: N/A   Social History Main Topics  . Smoking status: Current Some Day Smoker    Last attempt to quit: 12/05/2014  . Smokeless tobacco: Never Used  . Alcohol use 7.2 oz/week    12 Cans of beer per week     Comment: 4-5 drinks in a day; 2 times per week  . Drug use: No  . Sexual activity: Not Asked   Other Topics Concern  . 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  buPROPion (WELLBUTRIN SR) 150 MG 12 hr tablet, TAKE 1 TABLET BY MOUTH TWICE DAILY *PT NEEDS APPT **, Disp: 60 tablet, Rfl: 0 .  pravastatin (PRAVACHOL) 20 MG tablet, TAKE 1 TABLET BY MOUTH EVERY DAY, Disp: 30 tablet, Rfl: 0 .  buPROPion (WELLBUTRIN SR) 150 MG 12 hr tablet, Take 1 tablet (150 mg total) by mouth 2 (two) times daily., Disp: 180 tablet, Rfl: 0  EXAM:  Vitals:   10/21/16 1321  BP: 90/60  Pulse: 88  Temp: 98.2 F (36.8 C)    Body mass index is 27.52 kg/m.  GENERAL: vitals reviewed and listed above, alert, oriented, appears well hydrated and in no acute distress  HEENT: atraumatic, conjunttiva clear, no obvious abnormalities on inspection of external nose and ears  NECK: no obvious masses on inspection  LUNGS: clear to auscultation bilaterally, no wheezes, rales or rhonchi, good air movement  CV: HRRR, no peripheral edema  MS: moves all extremities without noticeable abnormality  PSYCH: pleasant and cooperative, no obvious depression or anxiety  ASSESSMENT AND PLAN:  Discussed the following assessment and  plan:  Major depressive disorder with single episode, in full remission ([place]) -restart wellbutrin -Wendie Simmer to refill for 90 days -see PHQ9, discussed and counseled  Pulmonary nodule - Plan: CT CHEST WO CONTRAST -advised again of her hx of pulm nodules and advise repeat CT again, given hx smoking/risks/reasons to repeat -advised assistant to place orders for CT  -advised pt to call us if does not have appt details for this in next 1-2 weeks  Hyperlipidemia, unspecified hyperlipidemia type -cont pravastatin, needs labs, not fasting today She agrees to schedule physical and do labs then.  -Patient advised to return or notify a doctor immediately if symptoms worsen or persist or new concerns arise.  Patient Instructions  BEFORE YOU LEAVE: -Wendie Simmer - schedule CT chest for follow up pulmonary nodules (not lung cancer screening) -schedule her a physical in next 1 month - plan labs then  We again advise a repeat CT scan of the chest for your history of pulmonary nodules and smoking. Placed this referral again today. If you have not heard about this appointment in 1-2 weeks please call our office.  Please restart the wellburtin.  Cut back on smoking.   We recommend the following healthy lifestyle for LIFE: 1) Small portions.   Tip: eat off of a salad plate instead of a dinner plate.  Tip: It is ok to feel hungry after a meal - that likely means you ate an appropriate portion.  Tip: if you need more or a snack choose fruits, veggies and/or a handful of nuts or seeds.  2) Eat a healthy clean diet.   TRY TO EAT: -at least 5-7 servings of low sugar vegetables per day (not corn, potatoes or bananas.) -berries are the best choice if you wish to eat fruit.   -lean meets (fish, chicken or [place] breasts) -vegan proteins for some meals - beans or tofu, whole grains, nuts and seeds -Replace bad fats with good fats - good fats include: fish, nuts and seeds, canola oil, olive oil -small  amounts of low fat or non fat dairy -small amounts of100 % whole grains - check the lables  AVOID: -SUGAR, sweets, anything with added sugar, corn syrup or sweeteners -if you must have a sweetener, small amounts of stevia may be best -sweetened beverages -simple starches (rice, bread, potatoes, pasta, chips, etc - small amounts of 100% whole grains are ok) -red meat, pork, butter -fried foods, fast food, processed food, excessive dairy, eggs and coconut.  3)Get at least 150 minutes of sweaty aerobic exercise per week.  4)Reduce stress - consider counseling, meditation and relaxation to balance other aspects of your life.    Colin Benton  R., DO

## 2016-11-12 ENCOUNTER — Other Ambulatory Visit: Payer: Self-pay | Admitting: Family Medicine

## 2016-11-14 NOTE — Progress Notes (Signed)
   Isabella Cannon is a 56 yo smoker with a history  of hyperlipidemia, GERD, depression, pulm nodules and poor compliance with recommendations, follow up and preventive care advice, here for a CPE.  Due for pap, repeat CT chest?, mammogram, tetanus booster, flu shot, hep c, hiv screen, lipid, hgba1c My assistant advised me prior to her visit with me that during her intake today she stated nobody ever told her she had pulm nodules. When we discussed this and her other preventive measures that were due today she refused them (except for hep c, lipid and hgba1c testing) and told me that nobody ever told her she had pulmonary nodules. I told her on her initial visit here verbally and in writing and on other visits here since - again verbally and in writing (see patient instructions from prior visits.) I did advise her of this today and offered to print these instructions again along with a copy of the original report.  I told her we want to help her get good care, discussed the reasons for each recommended measure/exam and also questioned barriers to getting the recommended care. She reported she has bad insurance and it may not pay. I offered to put here in touch with our practice administrator today and the financial assistance office to help with these concerns. She refused.  Prior to her physical exam, she became angry and said "nobody told me about pulmonary nodules...you just told me to get the CT" " I am not going to argue with your" "I just want my labs" and she walked out of the room. I let her know that my goal is to help her and asked her to stay. She refused and demanded to get labs and said "I am leaving." Discussed the case with practice administration Isabella Cannon) and will mail her pt instructions as she left before we could provide them. It was also felt that we should send a dismissal letter as our ability to care for her is compromised by her behavior. Isabella [place] agreed to print and send Isabella Cannon  dismissal once Isabella Cannon is back in the office.

## 2016-11-15 ENCOUNTER — Encounter: Payer: Self-pay | Admitting: Family Medicine

## 2016-11-15 ENCOUNTER — Ambulatory Visit (INDEPENDENT_AMBULATORY_CARE_PROVIDER_SITE_OTHER): Payer: BC Managed Care – PPO | Admitting: Family Medicine

## 2016-11-15 VITALS — BP 110/80 | HR 79 | Temp 98.0°F | Ht 64.25 in | Wt 160.4 lb

## 2016-11-15 DIAGNOSIS — E785 Hyperlipidemia, unspecified: Secondary | ICD-10-CM

## 2016-11-15 DIAGNOSIS — R739 Hyperglycemia, unspecified: Secondary | ICD-10-CM

## 2016-11-15 DIAGNOSIS — Z1159 Encounter for screening for other viral diseases: Secondary | ICD-10-CM

## 2016-11-15 DIAGNOSIS — R918 Other nonspecific abnormal finding of lung field: Secondary | ICD-10-CM

## 2016-11-15 LAB — HEMOGLOBIN A1C: HEMOGLOBIN A1C: 5.8 % (ref 4.6–6.5)

## 2016-11-15 LAB — LIPID PANEL
CHOLESTEROL: 259 mg/dL — AB (ref 0–200)
HDL: 75.8 mg/dL (ref >39.00)
LDL CALC: 164 mg/dL — AB (ref 0–99)
NonHDL: 183.67
TRIGLYCERIDES: 96 mg/dL (ref 0.0–149.0)
Total CHOL/HDL Ratio: 3
VLDL: 19.2 mg/dL (ref 0.0–40.0)

## 2016-11-15 NOTE — Patient Instructions (Addendum)
I advised you of the following recommendations today:  1) Please get the CT scan of your lungs for pulmonary nodules. We initially advised you of this at your first visit here in writing and verbally and have reordered this CT scan for you several times.   2) You are due for a pap smear for cervical cancer screening  3) you are due for a mammogram for breast cancer screening  You declined to do these at this time.  Weoffered to connect you with our practice administration and our financial assistance department to assist in the costs and scheduling of these tests and exams. You declined.  We strive to provide excellent care and wish to help you .However, there are times when the therapeutic relationship is compromised and we sometimes need to request that you find another primary care physician. I have notified my administrative team of this visit and they will likely be contacting you about this. Since you did not stay for your physical and refused the help and advice that we offered today, I did not place an office visit charge for this visit

## 2016-11-16 LAB — HEPATITIS C ANTIBODY: HCV Ab: NONREACTIVE

## 2017-04-12 ENCOUNTER — Other Ambulatory Visit: Payer: Self-pay | Admitting: *Deleted

## 2017-04-12 NOTE — Telephone Encounter (Signed)
Isabella Cannon,  See OV notes 11/15/16. This pt was to be sent a letter of dismissal. Please ensure this happened/happens and ensure she also got the patient instructions from that visit. If letter was not sent and is now being sent, it is ok to refill for 30 days and let pt know we can refill for 30 days while she finds new provider. Thanks.

## 2017-04-12 NOTE — Telephone Encounter (Signed)
Is this OK to refill?  Office note from 8/13 stated the pt was to be dismissed?

## 2017-04-14 ENCOUNTER — Encounter: Payer: Self-pay | Admitting: Family Medicine

## 2017-04-15 ENCOUNTER — Other Ambulatory Visit: Payer: Self-pay | Admitting: Family Medicine

## 2017-04-15 ENCOUNTER — Telehealth: Payer: Self-pay | Admitting: Family Medicine

## 2017-04-15 NOTE — Telephone Encounter (Signed)
Patient dismissed from [facility] by Colin Benton DO , effective April 14, 2017. Dismissal letter sent out by certified / registered mail.  daj

## 2017-04-20 NOTE — Telephone Encounter (Signed)
Received signed domestic return receipt verifying delivery of certified letter on April 16, 2017. Article number [id number]

## 2017-05-11 ENCOUNTER — Other Ambulatory Visit: Payer: Self-pay | Admitting: Internal Medicine

## 2017-05-11 DIAGNOSIS — Z1231 Encounter for screening mammogram for malignant neoplasm of breast: Secondary | ICD-10-CM

## 2017-05-15 ENCOUNTER — Other Ambulatory Visit: Payer: Self-pay | Admitting: Internal Medicine

## 2017-05-15 DIAGNOSIS — E7800 Pure hypercholesterolemia, unspecified: Secondary | ICD-10-CM

## 2017-05-15 DIAGNOSIS — R911 Solitary pulmonary nodule: Secondary | ICD-10-CM

## 2017-05-15 DIAGNOSIS — F17200 Nicotine dependence, unspecified, uncomplicated: Secondary | ICD-10-CM

## 2017-05-26 ENCOUNTER — Ambulatory Visit
Admission: RE | Admit: 2017-05-26 | Discharge: 2017-05-26 | Disposition: A | Discharge status: Home / Self Care | Payer: BC Managed Care – PPO | Source: Ambulatory Visit | Attending: Internal Medicine | Admitting: Internal Medicine

## 2017-05-26 DIAGNOSIS — Z1231 Encounter for screening mammogram for malignant neoplasm of breast: Secondary | ICD-10-CM

## 2017-05-27 ENCOUNTER — Other Ambulatory Visit: Payer: Self-pay | Admitting: Internal Medicine

## 2017-05-27 DIAGNOSIS — R928 Other abnormal and inconclusive findings on diagnostic imaging of breast: Secondary | ICD-10-CM

## 2017-05-31 ENCOUNTER — Ambulatory Visit
Admission: RE | Admit: 2017-05-31 | Discharge: 2017-05-31 | Disposition: A | Discharge status: Home / Self Care | Payer: BC Managed Care – PPO | Source: Ambulatory Visit | Attending: Internal Medicine | Admitting: Internal Medicine

## 2017-05-31 DIAGNOSIS — R928 Other abnormal and inconclusive findings on diagnostic imaging of breast: Secondary | ICD-10-CM

## 2017-06-15 ENCOUNTER — Other Ambulatory Visit: Payer: Self-pay

## 2017-07-26 ENCOUNTER — Ambulatory Visit: Payer: BC Managed Care – PPO | Admitting: Sports Medicine

## 2017-07-26 ENCOUNTER — Other Ambulatory Visit: Payer: Self-pay | Admitting: Sports Medicine

## 2017-07-26 ENCOUNTER — Encounter: Payer: Self-pay | Admitting: Sports Medicine

## 2017-07-26 ENCOUNTER — Ambulatory Visit (INDEPENDENT_AMBULATORY_CARE_PROVIDER_SITE_OTHER): Payer: BC Managed Care – PPO

## 2017-07-26 VITALS — BP 123/71 | HR 79 | Resp 16

## 2017-07-26 DIAGNOSIS — M722 Plantar fascial fibromatosis: Secondary | ICD-10-CM

## 2017-07-26 DIAGNOSIS — M79672 Pain in left foot: Secondary | ICD-10-CM | POA: Diagnosis not present

## 2017-07-26 MED ORDER — TRIAMCINOLONE ACETONIDE 10 MG/ML IJ SUSP: 10.0000 mg | Freq: Once | INTRAMUSCULAR | Status: AC

## 2017-07-26 NOTE — Progress Notes (Signed)
Subjective: Isabella Cannon is a 57 y.o. female patient presents to office with complaint of moderate heel pain on the left. Patient admits to post static dyskinesia for 1 month in duration. Patient has treated this problem with stretching, OTC insoles, changing shoes with no relief. Denies any other pedal complaints.   Review of Systems  All other systems reviewed and are negative.  Patient Active Problem List   Diagnosis Date Noted  . Pulmonary nodule 08/28/2015  . Major depressive disorder with single episode, in full remission ([place]) 08/28/2015  . Esophageal reflux 08/28/2015    Current Outpatient Medications on File Prior to Visit  Medication Sig Dispense Refill  . buPROPion (WELLBUTRIN SR) 150 MG 12 hr tablet Take 1 tablet (150 mg total) by mouth 2 (two) times daily. (Patient taking differently: Take 300 mg by mouth daily. ) 180 tablet 0  . meloxicam (MOBIC) 7.5 MG tablet Take 7.5 mg by mouth 2 (two) times daily as needed.  0  . pravastatin (PRAVACHOL) 20 MG tablet TAKE 1 TABLET BY MOUTH EVERY DAY 30 tablet 5   No current facility-administered medications on file prior to visit.     No Known Allergies  Objective: Physical Exam General: The patient is alert and oriented x3 in no acute distress.  Dermatology: Skin is warm, dry and supple bilateral lower extremities. Nails 1-10 are normal. There is no erythema, edema, no eccymosis, no open lesions present. Integument is otherwise unremarkable.  Vascular: Dorsalis Pedis pulse and Posterior Tibial pulse are 2/4 bilateral. Capillary fill time is immediate to all digits.  Neurological: Grossly intact to light touch with an achilles reflex of +2/5 and a  negative Tinel's sign bilateral.  Musculoskeletal: Tenderness to palpation at the medial calcaneal tubercale and through the insertion of the plantar fascia on the left foot. No pain with compression of calcaneus bilateral. No pain with tuning fork to calcaneus bilateral. No pain with  calf compression bilateral. There is decreased Ankle joint range of motion bilateral. All other joints range of motion within normal limits bilateral. Strength 5/5 in all groups bilateral.   Gait: Unassisted, Antalgic avoid weight on left heel  Xray, Left foot:  Normal osseous mineralization. Joint spaces preserved. No fracture/dislocation/boney destruction. Calcaneal spur present with mild thickening of plantar fascia. No other soft tissue abnormalities or radiopaque foreign bodies.   Assessment and Plan: Problem List Items Addressed This Visit    None    Visit Diagnoses    Plantar fasciitis, left    -  Primary   Left foot pain          -Complete examination performed.  -Xrays reviewed -Discussed with patient in detail the condition of plantar fasciitis, how this occurs and general treatment options. Explained both conservative and surgical treatments.  -After oral consent and aseptic prep, injected a mixture containing 1 ml of 2% plain lidocaine, 1 ml 0.5% plain marcaine, 0.5 ml of kenalog 10 and 0.5 ml of dexamethasone phosphate into left heel. Post-injection care discussed with patient.  -Continue with mobic of which she already has -Recommended good supportive shoes and advised use of OTC insert. Explained to patient that if these orthoses work well, we will continue with these. If these do not improve her condition and  pain, we will consider custom molded orthoses. - Explained in detail the use of the fascial brace for left which was dispensed at today's visit. -Explained and dispensed to patient daily stretching exercises. -Recommend patient to ice affected area 1-2x daily. -Patient  to return to office in 3-4 weeks for follow up or sooner if problems or questions arise.  Landis Martins, DPM

## 2017-07-26 NOTE — Patient Instructions (Signed)

## 2017-09-20 ENCOUNTER — Ambulatory Visit: Payer: BC Managed Care – PPO | Admitting: Sports Medicine

## 2017-10-11 ENCOUNTER — Ambulatory Visit: Payer: BC Managed Care – PPO | Admitting: Sports Medicine

## 2017-10-11 ENCOUNTER — Encounter: Payer: Self-pay | Admitting: Sports Medicine

## 2017-10-11 DIAGNOSIS — M722 Plantar fascial fibromatosis: Secondary | ICD-10-CM

## 2017-10-11 DIAGNOSIS — M79672 Pain in left foot: Secondary | ICD-10-CM

## 2017-10-11 MED ORDER — TRAMADOL HCL 50 MG PO TABS: 50.0000 mg | ORAL_TABLET | Freq: Four times a day (QID) | ORAL | 0 refills | Status: AC | PRN

## 2017-10-11 MED ORDER — TRIAMCINOLONE ACETONIDE 40 MG/ML IJ SUSP: 20.0000 mg | Freq: Once | INTRAMUSCULAR | Status: AC

## 2017-10-11 MED ORDER — METHYLPREDNISOLONE 4 MG PO TBPK: ORAL_TABLET | ORAL | 0 refills | Status: AC

## 2017-10-11 NOTE — Progress Notes (Signed)
Subjective: Isabella Cannon is a 57 y.o. female returns to office for follow up evaluation after Left heel injection for plantar fasciitis, injection #1 administered 9 weeks ago. Patient states that the injection did not last, helped for 4 days and now pain is back 10/10, sharp pain and nothing has helped. Reports that she has been stretching, icing, wear brace, sneakers and OTC insoles without relief. Patient denies any recent changes in medications or new problems since last visit.   Patient Active Problem List   Diagnosis Date Noted  . Pulmonary nodule 08/28/2015  . Major depressive disorder with single episode, in full remission ([place]) 08/28/2015  . Esophageal reflux 08/28/2015    Current Outpatient Medications on File Prior to Visit  Medication Sig Dispense Refill  . buPROPion (WELLBUTRIN SR) 150 MG 12 hr tablet Take 1 tablet (150 mg total) by mouth 2 (two) times daily. (Patient taking differently: Take 300 mg by mouth daily. ) 180 tablet 0  . meloxicam (MOBIC) 7.5 MG tablet Take 7.5 mg by mouth 2 (two) times daily as needed.  0  . pravastatin (PRAVACHOL) 20 MG tablet TAKE 1 TABLET BY MOUTH EVERY DAY 30 tablet 5   Current Facility-Administered Medications on File Prior to Visit  Medication Dose Route Frequency Provider Last Rate Last Dose  . triamcinolone acetonide (KENALOG) 10 MG/ML injection 10 mg  10 mg Other Once Landis Martins, DPM        No Known Allergies  Objective:   General:  Alert and oriented x 3, in no acute distress  Dermatology: Skin is warm, dry, and supple bilateral. Nails are within normal limits. There is no lower extremity erythema, no eccymosis, no open lesions present bilateral.   Vascular: Dorsalis Pedis and Posterior Tibial pedal pulses are 2/4 bilateral. + hair growth noted bilateral. Capillary Fill Time is 3 seconds in all digits. No varicosities, No edema bilateral lower extremities.   Neurological: Sensation grossly intact to light  touch.  Musculoskeletal: There is tenderness to palpation at the medial calcaneal tubercale and through the insertion of the plantar fascia on the Left foot. No pain with compression to calcaneus or application of tuning fork. There is decreased Ankle joint range of motion bilateral. All other joints range of motion  within normal limits bilateral. Strength 5/5 bilateral.   Assessment and Plan: Problem List Items Addressed This Visit    None    Visit Diagnoses    Plantar fasciitis, left    -  Primary   Relevant Medications   triamcinolone acetonide (KENALOG-40) injection 20 mg   methylPREDNISolone (MEDROL DOSEPAK) 4 MG TBPK tablet   Left foot pain       Relevant Medications   traMADol (ULTRAM) 50 MG tablet      -Complete examination performed.  -Previous x-rays reviewed. -Discussed with patient in detail the condition of plantar fasciitis, how this  occurs related to the foot type of the patient and general treatment options. - Patient opted for another injection today; After oral consent and aseptic prep, injected a mixture containing 1 ml of 1%plain lidocaine, 1 ml 0.5% plain marcaine, 0.5 ml of kenalog 40 and 0.5 ml of dexmethasone phosphate to left heel at area of most pain/trigger point injection. -Dispensed Left CAM boot to wear for 2 weeks -Rx Medrol dose pak and tramadol for severe pain to heel -Continue with stretching, icing outside of boot -Discussed long term care and reocurrence; will closely monitor; if fails to improve will consider other treatment modalities.  -Patient  to return to office in 3 weeks for follow up or sooner if problems or questions arise.  Landis Martins, DPM

## 2017-11-01 ENCOUNTER — Ambulatory Visit: Payer: BC Managed Care – PPO | Admitting: Sports Medicine

## 2017-11-18 ENCOUNTER — Telehealth: Payer: Self-pay | Admitting: Sports Medicine

## 2017-11-18 NOTE — Telephone Encounter (Signed)
She should stay in her CAM boot if she has one and we can give Tramadol for pain. She may benefit from having a day of rest from work to see if this will help her foot get better. Rest, ice, elevation as well. -Dr. Cannon Kettle

## 2017-11-18 NOTE — Telephone Encounter (Signed)
I've been seeing Dr. Cannon Kettle for my foot. I have had a cortisone shot, boot, a brace, shoe inserts, and medication and nothing is helping. I cannot hardly walk and the past two day's I've come home crying from work. Somebody please give me a call at [phone]. Thank you.

## 2017-11-18 NOTE — Telephone Encounter (Signed)
Dr Cannon Kettle can you please advise before I call the patient back

## 2017-11-22 ENCOUNTER — Ambulatory Visit (INDEPENDENT_AMBULATORY_CARE_PROVIDER_SITE_OTHER): Payer: BC Managed Care – PPO | Admitting: Sports Medicine

## 2017-11-22 ENCOUNTER — Encounter: Payer: Self-pay | Admitting: Sports Medicine

## 2017-11-22 DIAGNOSIS — M722 Plantar fascial fibromatosis: Secondary | ICD-10-CM

## 2017-11-22 DIAGNOSIS — M79672 Pain in left foot: Secondary | ICD-10-CM

## 2017-11-22 NOTE — Patient Instructions (Signed)

## 2017-11-22 NOTE — Progress Notes (Signed)
Subjective: Isabella Cannon is a 57 y.o. female returns to office for follow up evaluation after Left heel injection for plantar fasciitis, injection #2 administered 4 weeks ago. Patient states that the injection did not last, pain is 10/10, sharp pain and nothing has helped. Reports that she has been stretching, icing, wore boot and wears brace, sneakers and OTC insoles without relief. Patient denies any recent changes in medications or new problems since last visit.   Works as a Chiropodist   Patient Active Problem List   Diagnosis Date Noted  . Pulmonary nodule 08/28/2015  . Major depressive disorder with single episode, in full remission ([place]) 08/28/2015  . Esophageal reflux 08/28/2015    Current Outpatient Medications on File Prior to Visit  Medication Sig Dispense Refill  . buPROPion (WELLBUTRIN SR) 150 MG 12 hr tablet Take 1 tablet (150 mg total) by mouth 2 (two) times daily. (Patient taking differently: Take 300 mg by mouth daily. ) 180 tablet 0  . meloxicam (MOBIC) 7.5 MG tablet Take 7.5 mg by mouth 2 (two) times daily as needed.  0  . methylPREDNISolone (MEDROL DOSEPAK) 4 MG TBPK tablet Take as directed 21 tablet 0  . pravastatin (PRAVACHOL) 20 MG tablet TAKE 1 TABLET BY MOUTH EVERY DAY 30 tablet 5  . pravastatin (PRAVACHOL) 40 MG tablet      Current Facility-Administered Medications on File Prior to Visit  Medication Dose Route Frequency Provider Last Rate Last Dose  . triamcinolone acetonide (KENALOG) 10 MG/ML injection 10 mg  10 mg Other Once [place], Ambria [place], DPM      . triamcinolone acetonide (KENALOG-40) injection 20 mg  20 mg Other Once Landis Martins, DPM        No Known Allergies  Objective:   General:  Alert and oriented x 3, in no acute distress  Dermatology: Skin is warm, dry, and supple bilateral. Nails are within normal limits. There is no lower extremity erythema, no eccymosis, no open lesions present bilateral.   Vascular: Dorsalis Pedis and Posterior  Tibial pedal pulses are 2/4 bilateral. + hair growth noted bilateral. Capillary Fill Time is 3 seconds in all digits. No varicosities, No edema bilateral lower extremities.   Neurological: Sensation grossly intact to light touch.  Musculoskeletal: There is tenderness to palpation at the medial calcaneal tubercale and through the insertion of the plantar fascia on the Left foot. No pain with compression to calcaneus or application of tuning fork. There is decreased Ankle joint range of motion bilateral. All other joints range of motion  within normal limits bilateral. Strength 5/5 bilateral.   Assessment and Plan: Problem List Items Addressed This Visit    None    Visit Diagnoses    Plantar fasciitis, left    -  Primary   Left foot pain          -Complete examination performed.  -Previous x-rays reviewed. -Discussed with patient in detail the condition of plantar fasciitis, how this  occurs related to the foot type of the patient and general treatment options. -Patient reports that she can not afford EPAT and does not want to take time off work for surgery -Applied plantar fascial taping to the left at no charge -Rx PT with treatment modalities may benefit from dry needling  -Continue with stretching, icing and may return to CAM boot for bad flare -Discussed long term care and reocurrence; will closely monitor; if fails to improve will consider other treatment modalities.  -Patient to return to office in 4  weeks for follow up eval after starting PT or sooner if problems or questions arise.  Landis Martins, DPM

## 2017-11-23 ENCOUNTER — Telehealth: Payer: Self-pay | Admitting: *Deleted

## 2017-11-23 DIAGNOSIS — M722 Plantar fascial fibromatosis: Secondary | ICD-10-CM

## 2017-11-23 NOTE — Telephone Encounter (Signed)
-----   Message from Landis Martins, [place] sent at 11/22/2017  4:53 PM EDT ----- Regarding: PT in Office with [facility] Left plantar fasciitis

## 2017-11-23 NOTE — Telephone Encounter (Signed)
Hand delivered required form to [facility].

## 2018-03-18 ENCOUNTER — Other Ambulatory Visit: Payer: Self-pay

## 2018-03-18 ENCOUNTER — Encounter (HOSPITAL_COMMUNITY): Payer: Self-pay | Admitting: Emergency Medicine

## 2018-03-18 ENCOUNTER — Emergency Department (HOSPITAL_COMMUNITY): Payer: BC Managed Care – PPO

## 2018-03-18 ENCOUNTER — Emergency Department (HOSPITAL_COMMUNITY)
Admission: EM | Admit: 2018-03-18 | Discharge: 2018-03-18 | Disposition: A | Discharge status: Home / Self Care | Payer: BC Managed Care – PPO | Attending: Emergency Medicine | Admitting: Emergency Medicine

## 2018-03-18 DIAGNOSIS — Z79899 Other long term (current) drug therapy: Secondary | ICD-10-CM | POA: Diagnosis not present

## 2018-03-18 DIAGNOSIS — N201 Calculus of ureter: Secondary | ICD-10-CM | POA: Insufficient documentation

## 2018-03-18 DIAGNOSIS — R1032 Left lower quadrant pain: Secondary | ICD-10-CM | POA: Diagnosis present

## 2018-03-18 DIAGNOSIS — F17210 Nicotine dependence, cigarettes, uncomplicated: Secondary | ICD-10-CM | POA: Insufficient documentation

## 2018-03-18 LAB — URINALYSIS, ROUTINE W REFLEX MICROSCOPIC
BILIRUBIN URINE: NEGATIVE
Bacteria, UA: NONE SEEN
Glucose, UA: NEGATIVE mg/dL
Ketones, ur: NEGATIVE mg/dL
LEUKOCYTES UA: NEGATIVE
Nitrite: NEGATIVE
PH: 5 (ref 5.0–8.0)
Protein, ur: NEGATIVE mg/dL
SPECIFIC GRAVITY, URINE: 1.02 (ref 1.005–1.030)

## 2018-03-18 LAB — COMPREHENSIVE METABOLIC PANEL
ALT: 30 U/L (ref 0–44)
AST: 25 U/L (ref 15–41)
Albumin: 4.5 g/dL (ref 3.5–5.0)
Alkaline Phosphatase: 82 U/L (ref 38–126)
Anion gap: 10 (ref 5–15)
BUN: 25 mg/dL — ABNORMAL HIGH (ref 6–20)
CO2: 25 mmol/L (ref 22–32)
Calcium: 9.7 mg/dL (ref 8.9–10.3)
Chloride: 107 mmol/L (ref 98–111)
Creatinine, Ser: 1.04 mg/dL — ABNORMAL HIGH (ref 0.44–1.00)
GFR calc Af Amer: >60 mL/min (ref >60)
GFR calc non Af Amer: 59 mL/min — ABNORMAL LOW (ref >60)
Glucose, Bld: 128 mg/dL — ABNORMAL HIGH (ref 70–99)
Potassium: 3.9 mmol/L (ref 3.5–5.1)
Sodium: 142 mmol/L (ref 135–145)
Total Bilirubin: 0.8 mg/dL (ref 0.3–1.2)
Total Protein: 7.2 g/dL (ref 6.5–8.1)

## 2018-03-18 LAB — CBC
HCT: 41.3 % (ref 36.0–46.0)
Hemoglobin: 13.5 g/dL (ref 12.0–15.0)
MCH: 31.3 pg (ref 26.0–34.0)
MCHC: 32.7 g/dL (ref 30.0–36.0)
MCV: 95.6 fL (ref 80.0–100.0)
NRBC: 0 % (ref 0.0–0.2)
PLATELETS: 357 10*3/uL (ref 150–400)
RBC: 4.32 MIL/uL (ref 3.87–5.11)
RDW: 12.3 % (ref 11.5–15.5)
WBC: 14 10*3/uL — AB (ref 4.0–10.5)

## 2018-03-18 LAB — I-STAT BETA HCG BLOOD, ED (MC, WL, AP ONLY): HCG, QUANTITATIVE: 5 m[IU]/mL — AB (ref <5)

## 2018-03-18 LAB — LIPASE, BLOOD: Lipase: 26 U/L (ref 11–51)

## 2018-03-18 MED ORDER — MORPHINE SULFATE (PF) 4 MG/ML IV SOLN: 4.0000 mg | Freq: Once | INTRAVENOUS | Status: DC

## 2018-03-18 MED ORDER — TAMSULOSIN HCL 0.4 MG PO CAPS: 0.4000 mg | ORAL_CAPSULE | Freq: Every day | ORAL | 0 refills | Status: AC

## 2018-03-18 MED ORDER — ONDANSETRON 8 MG PO TBDP: 8.0000 mg | ORAL_TABLET | Freq: Three times a day (TID) | ORAL | 0 refills | Status: AC | PRN

## 2018-03-18 MED ORDER — OXYCODONE-ACETAMINOPHEN 5-325 MG PO TABS
1.0000 | ORAL_TABLET | Freq: Once | ORAL | Status: AC
  Administered 2018-03-18: 1 via ORAL
  Filled 2018-03-18: qty 1

## 2018-03-18 MED ORDER — MORPHINE SULFATE (PF) 4 MG/ML IV SOLN
6.0000 mg | Freq: Once | INTRAVENOUS | Status: AC
  Administered 2018-03-18: 6 mg via INTRAVENOUS
  Filled 2018-03-18: qty 2

## 2018-03-18 MED ORDER — ONDANSETRON HCL 4 MG/2ML IJ SOLN
4.0000 mg | Freq: Once | INTRAMUSCULAR | Status: AC
  Administered 2018-03-18: 4 mg via INTRAVENOUS
  Filled 2018-03-18: qty 2

## 2018-03-18 MED ORDER — KETOROLAC TROMETHAMINE 30 MG/ML IJ SOLN
30.0000 mg | Freq: Once | INTRAMUSCULAR | Status: AC
  Administered 2018-03-18: 30 mg via INTRAVENOUS
  Filled 2018-03-18: qty 1

## 2018-03-18 MED ORDER — ACETAMINOPHEN 325 MG PO TABS
650.0000 mg | ORAL_TABLET | Freq: Once | ORAL | Status: AC
  Administered 2018-03-18: 650 mg via ORAL
  Filled 2018-03-18: qty 2

## 2018-03-18 MED ORDER — OXYCODONE-ACETAMINOPHEN 5-325 MG PO TABS: 1.0000 | ORAL_TABLET | ORAL | 0 refills | Status: AC | PRN

## 2018-03-18 NOTE — ED Provider Notes (Signed)
[place] [facility] DEPT Provider Note   CSN: [id number] Arrival date & time: 03/18/18  1059     History   Chief Complaint Chief Complaint  Patient presents with  . Abdominal Pain  . Pelvic Pain    HPI Isabella Cannon is a 57 y.o. female.  HPI Patient is a 57 year old female presents the emergency department with severe left lower quadrant pain with radiation towards her left groin or left flank.  No prior history of kidney stones.  Reports nausea vomiting.  Pain began abruptly this morning.  No fevers or chills.  No dysuria or urinary frequency.  Pain is severe in severity and waxing and waning.   Past Medical History:  Diagnosis Date  . Allergy   . Depression   . Endometriosis   . Hyperlipidemia     Patient Active Problem List   Diagnosis Date Noted  . Pulmonary nodule 08/28/2015  . Major depressive disorder with single episode, in full remission ([place]) 08/28/2015  . Esophageal reflux 08/28/2015    Past Surgical History:  Procedure Laterality Date  . endometrial surgery       OB History   No obstetric history on file.      Home Medications    Prior to Admission medications   Medication Sig Start Date End Date Taking? Authorizing Provider  buPROPion (WELLBUTRIN SR) 150 MG 12 hr tablet Take 1 tablet (150 mg total) by mouth 2 (two) times daily. Patient taking differently: Take 300 mg by mouth daily.  10/21/16  Yes Colin Benton R, DO  pravastatin (PRAVACHOL) 40 MG tablet Take 40 mg by mouth daily.  11/09/17  Yes [provider]  Probiotic Product (PROBIOTIC PO) Take 1 capsule by mouth daily.   Yes [provider]  methylPREDNISolone (MEDROL DOSEPAK) 4 MG TBPK tablet Take as directed Patient not taking: Reported on 03/18/2018 10/11/17   Landis Martins, DPM  ondansetron (ZOFRAN ODT) 8 MG disintegrating tablet Take 1 tablet (8 mg total) by mouth every 8 (eight) hours as needed for nausea or vomiting. 03/18/18   Jola Schmidt, MD    oxyCODONE-acetaminophen (PERCOCET/ROXICET) 5-325 MG tablet Take 1 tablet by mouth every 4 (four) hours as needed for severe pain. 03/18/18   Jola Schmidt, MD  pravastatin (PRAVACHOL) 20 MG tablet TAKE 1 TABLET BY MOUTH EVERY DAY Patient not taking: No sig reported 11/12/16   Lucretia Kern, DO  tamsulosin (FLOMAX) 0.4 MG CAPS capsule Take 1 capsule (0.4 mg total) by mouth daily. 03/18/18   Jola Schmidt, MD    Family History Family History  Problem Relation Age of Onset  . COPD Father   . Heart disease Father        chf, MI  . Hyperlipidemia Father   . Hypertension Father   . Arthritis Sister   . Colon cancer Neg Hx   . Esophageal cancer Neg Hx   . Rectal cancer Neg Hx   . Stomach cancer Neg Hx     Social History Social History   Tobacco Use  . Smoking status: Current Some Day Smoker    Last attempt to quit: 12/05/2014    Years since quitting: 3.2  . Smokeless tobacco: Never Used  Substance Use Topics  . Alcohol use: Yes    Alcohol/week: 12.0 standard drinks    Types: 12 Cans of beer per week    Comment: 4-5 drinks in a day; 2 times per week  . Drug use: No     Allergies  Patient has no known allergies.   Review of Systems Review of Systems  All other systems reviewed and are negative.    Physical Exam Updated Vital Signs BP 113/78 (BP Location: Left Arm)   Pulse 84   Temp 98.7 F (37.1 C) (Oral)   Resp 16   Ht 5\' 5"  (1.651 m)   Wt 74.8 kg   SpO2 100%   BMI 27.46 kg/m   Physical Exam Vitals signs and nursing note reviewed.  Constitutional:      General: She is not in acute distress.    Appearance: She is well-developed.  HENT:     Head: Normocephalic and atraumatic.  Neck:     Musculoskeletal: Normal range of motion.  Cardiovascular:     Rate and Rhythm: Normal rate and regular rhythm.     Heart sounds: Normal heart sounds.  Pulmonary:     Effort: Pulmonary effort is normal.     Breath sounds: Normal breath sounds.  Abdominal:     General:  There is no distension.     Palpations: Abdomen is soft.     Tenderness: There is no abdominal tenderness.  Musculoskeletal: Normal range of motion.  Skin:    General: Skin is warm and dry.  Neurological:     Mental Status: She is alert and oriented to person, place, and time.  Psychiatric:        Judgment: Judgment normal.      ED Treatments / Results  Labs (all labs ordered are listed, but only abnormal results are displayed) Labs Reviewed  COMPREHENSIVE METABOLIC PANEL - Abnormal; Notable for the following components:      Result Value   Glucose, Bld 128 (*)    BUN 25 (*)    Creatinine, Ser 1.04 (*)    GFR calc non Af Amer 59 (*)    All other components within normal limits  CBC - Abnormal; Notable for the following components:   WBC 14.0 (*)    All other components within normal limits  I-STAT BETA HCG BLOOD, ED ([facility], [facility], [facility] ONLY) - Abnormal; Notable for the following components:   I-stat hCG, quantitative 5.0 (*)    All other components within normal limits  LIPASE, BLOOD  URINALYSIS, ROUTINE W REFLEX MICROSCOPIC    EKG None  Radiology Ct Renal Stone Study  Result Date: 03/18/2018 CLINICAL DATA:  Acute left lower quadrant abdominal pain. EXAM: CT ABDOMEN AND PELVIS WITHOUT CONTRAST TECHNIQUE: Multidetector CT imaging of the abdomen and pelvis was performed following the standard protocol without IV contrast. COMPARISON:  None. FINDINGS: Lower chest: No acute abnormality. Hepatobiliary: No focal liver abnormality is seen. No gallstones, gallbladder wall thickening, or biliary dilatation. Pancreas: Unremarkable. No pancreatic ductal dilatation or surrounding inflammatory changes. Spleen: Normal in size without focal abnormality. Adrenals/Urinary Tract: Adrenal glands appear normal. Nonobstructive right nephrolithiasis is noted. Mild left hydroureteronephrosis is noted secondary to 3 mm calculus at the left ureterovesical junction. Urinary bladder is otherwise unremarkable.  Stomach/Bowel: Stomach is within normal limits. Appendix appears normal. No evidence of bowel wall thickening, distention, or inflammatory changes. Vascular/Lymphatic: Aortic atherosclerosis. No enlarged abdominal or pelvic lymph nodes. Reproductive: Uterus and bilateral adnexa are unremarkable. Other: No abdominal wall hernia or abnormality. No abdominopelvic ascites. Musculoskeletal: No acute or significant osseous findings. IMPRESSION: Nonobstructive right nephrolithiasis. Mild left hydroureteronephrosis secondary to 3 mm calculus at the left ureterovesical junction. Aortic Atherosclerosis (ICD10-I70.0). Electronically Signed   By: Marijo Conception, M.D.   On: 03/18/2018 14:25  Procedures Procedures (including critical care time)  Medications Ordered in ED Medications  ondansetron (ZOFRAN) injection 4 mg (4 mg Intravenous Given 03/18/18 1336)  ketorolac (TORADOL) 30 MG/ML injection 30 mg (30 mg Intravenous Given 03/18/18 1336)  oxyCODONE-acetaminophen (PERCOCET/ROXICET) 5-325 MG per tablet 1 tablet (1 tablet Oral Given 03/18/18 1526)  acetaminophen (TYLENOL) tablet 650 mg (650 mg Oral Given 03/18/18 1526)  morphine 4 MG/ML injection 6 mg (6 mg Intravenous Given 03/18/18 1526)     Initial Impression / Assessment and Plan / ED Course  I have reviewed the triage vital signs and the nursing notes.  Pertinent labs & imaging results that were available during my care of the patient were reviewed by me and considered in my medical decision making (see chart for details).     Patient with evidence of left ureteral stone.  Pain controlled here in the emergency department.  Discharged home with standard stone precautions.  Urology follow-up.  Patient encouraged to return the emergency department for new or worsening symptoms  Final Clinical Impressions(s) / ED Diagnoses   Final diagnoses:  Left ureteral stone    ED Discharge Orders         Ordered    ondansetron (ZOFRAN ODT) 8 MG  disintegrating tablet  Every 8 hours PRN     03/18/18 1550    tamsulosin (FLOMAX) 0.4 MG CAPS capsule  Daily     03/18/18 1550    oxyCODONE-acetaminophen (PERCOCET/ROXICET) 5-325 MG tablet  Every 4 hours PRN     03/18/18 1550           Jola Schmidt, MD 03/18/18 1553

## 2018-03-18 NOTE — Discharge Instructions (Addendum)
Please call the urology office for follow-up  Return in the emergency department for any new or worsening symptoms

## 2018-03-18 NOTE — ED Triage Notes (Signed)
Patient c/o LLQ and left pelvic pain since this morning. Reports N/V. Denies diarrhea. Reports dysuria and urinary frequency.

## 2018-10-02 ENCOUNTER — Encounter: Payer: Self-pay | Admitting: Internal Medicine

## 2019-05-18 DIAGNOSIS — R040 Epistaxis: Secondary | ICD-10-CM | POA: Insufficient documentation

## 2019-07-31 ENCOUNTER — Ambulatory Visit (INDEPENDENT_AMBULATORY_CARE_PROVIDER_SITE_OTHER): Payer: BC Managed Care – PPO

## 2019-07-31 ENCOUNTER — Other Ambulatory Visit: Payer: Self-pay

## 2019-07-31 ENCOUNTER — Ambulatory Visit: Payer: BC Managed Care – PPO | Admitting: Sports Medicine

## 2019-07-31 ENCOUNTER — Encounter: Payer: Self-pay | Admitting: Sports Medicine

## 2019-07-31 DIAGNOSIS — M722 Plantar fascial fibromatosis: Secondary | ICD-10-CM

## 2019-07-31 DIAGNOSIS — M79672 Pain in left foot: Secondary | ICD-10-CM | POA: Diagnosis not present

## 2019-07-31 DIAGNOSIS — M779 Enthesopathy, unspecified: Secondary | ICD-10-CM | POA: Diagnosis not present

## 2019-07-31 MED ORDER — HYDROCODONE-ACETAMINOPHEN 10-325 MG PO TABS: 1.0000 | ORAL_TABLET | Freq: Four times a day (QID) | ORAL | 0 refills | Status: AC | PRN

## 2019-07-31 MED ORDER — TRIAMCINOLONE ACETONIDE 10 MG/ML IJ SUSP
10.0000 mg | Freq: Once | INTRAMUSCULAR | Status: AC
  Administered 2019-07-31: 10 mg

## 2019-07-31 MED ORDER — PREDNISONE 10 MG (21) PO TBPK: ORAL_TABLET | ORAL | 0 refills | Status: AC

## 2019-07-31 NOTE — Progress Notes (Signed)
Subjective: Isabella Cannon is a 59 y.o. female returns to office for follow up evaluation of left foot pain reports that her heel still hurts and now pain at lateral ankle going up leg. Pain has gotten worse, can not work in CAM boot but now even when she tries to wear it at home it does not help. Patient denies any recent changes in medications or new problems since last visit 2 years ago.   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  Patient Active Problem List   Diagnosis Date Noted  . Epistaxis, recurrent 05/18/2019  . Pulmonary nodule 08/28/2015  . Major depressive disorder with single episode, in full remission ([place]) 08/28/2015  . Esophageal reflux 08/28/2015    Current Outpatient Medications on File Prior to Visit  Medication Sig Dispense Refill  . buPROPion (WELLBUTRIN SR) 150 MG 12 hr tablet Take 1 tablet (150 mg total) by mouth 2 (two) times daily. (Patient taking differently: Take 300 mg by mouth daily. ) 180 tablet 0  . ibuprofen (ADVIL) 800 MG tablet Take 800 mg by mouth every 4 (four) hours as needed.    . methylPREDNISolone (MEDROL DOSEPAK) 4 MG TBPK tablet Take as directed 21 tablet 0  . ondansetron (ZOFRAN ODT) 8 MG disintegrating tablet Take 1 tablet (8 mg total) by mouth every 8 (eight) hours as needed for nausea or vomiting. 10 tablet 0  . oxyCODONE-acetaminophen (PERCOCET/ROXICET) 5-325 MG tablet Take 1 tablet by mouth every 4 (four) hours as needed for severe pain. 15 tablet 0  . pravastatin (PRAVACHOL) 20 MG tablet TAKE 1 TABLET BY MOUTH EVERY DAY 30 tablet 5  . pravastatin (PRAVACHOL) 40 MG tablet Take 40 mg by mouth daily.     . Probiotic Product (PROBIOTIC PO) Take 1 capsule by mouth daily.    . tamsulosin (FLOMAX) 0.4 MG CAPS capsule Take 1 capsule (0.4 mg total) by mouth daily. 10 capsule 0   Current Facility-Administered Medications on File Prior to Visit  Medication Dose Route Frequency Provider Last Rate Last Admin  . triamcinolone acetonide (KENALOG) 10 MG/ML  injection 10 mg  10 mg Other Once [place], Grethel Zenk, DPM      . triamcinolone acetonide (KENALOG-40) injection 20 mg  20 mg Other Once Landis Martins, DPM        No Known Allergies  Objective:   General:  Alert and oriented x 3, in no acute distress  Dermatology: Skin is warm, dry, and supple bilateral. Nails are within normal limits. There is no lower extremity erythema, no eccymosis, no open lesions present bilateral.   Vascular: Dorsalis Pedis and Posterior Tibial pedal pulses are 2/4 bilateral. + hair growth noted bilateral. Capillary Fill Time is 3 seconds in all digits. No varicosities, No edema bilateral lower extremities.   Neurological: Sensation grossly intact to light touch.  Musculoskeletal: There is moderate tenderness to palpation at the medial calcaneal tubercale and through the insertion of the plantar fascia on the Left foot. New pain at lateral ankle and peroneals on left. No pain with compression to calcaneus or application of tuning fork. There is decreased Ankle joint range of motion bilateral. All other joints range of motion  within normal limits bilateral. Strength 5/5 bilateral.   Assessment and Plan: Problem List Items Addressed This Visit    None    Visit Diagnoses    Plantar fasciitis of left foot    -  Primary   Relevant Medications   predniSONE (STERAPRED UNI-PAK 21 TAB) 10 MG (21) TBPK  tablet   HYDROcodone-acetaminophen (NORCO) 10-325 MG tablet   triamcinolone acetonide (KENALOG) 10 MG/ML injection 10 mg (Completed) (Start on 07/31/2019 10:45 PM)   Other Relevant Orders   DG Foot Complete Left (Completed)   Tendonitis       Left foot pain         -Complete examination performed.  -Xrays consistent with heel spur, no fracture  -Re-Discussed with patient in detail the condition of plantar fasciitis, chronic and lateral ankle pain, how this occurs related to the foot type of the patient and general treatment options. -After oral consent and aseptic prep,  injected a mixture containing 1 ml of 2%  plain lidocaine, 1 ml 0.5% plain marcaine, 0.5 ml of kenalog 10 and 0.5 ml of dexamethasone phosphate into left heel without complication. Post-injection care discussed with patient.  -Continue with stretching, icing and may return to CAM boot for bad flare out side of work and recommend to use trilock while at work -Rx prednisone -[place] for severe pain and advised on use; can not take while working or making important decisins -Advised patient if pain is not better after 2 weeks to call office for [place] to proceed with order for MRI for possible need for surgery planning  -Patient to return to office if symptoms fail to improve.   Landis Martins, DPM

## 2019-09-11 ENCOUNTER — Ambulatory Visit: Payer: BC Managed Care – PPO | Admitting: Sports Medicine

## 2019-09-11 ENCOUNTER — Other Ambulatory Visit: Payer: Self-pay

## 2019-09-11 ENCOUNTER — Ambulatory Visit: Payer: Self-pay

## 2019-09-11 DIAGNOSIS — M779 Enthesopathy, unspecified: Secondary | ICD-10-CM

## 2019-09-11 DIAGNOSIS — M722 Plantar fascial fibromatosis: Secondary | ICD-10-CM

## 2019-09-11 DIAGNOSIS — M79672 Pain in left foot: Secondary | ICD-10-CM | POA: Diagnosis not present

## 2019-09-11 DIAGNOSIS — M24572 Contracture, left ankle: Secondary | ICD-10-CM

## 2019-09-11 NOTE — Patient Instructions (Signed)
Pre-Operative Instructions  Congratulations, you have decided to take an important step towards improving your quality of life.  You can be assured that the doctors and staff at [facility] will be with you every step of the way.  Here are some important things you should know:  1. Plan to be at the surgery center/hospital at least 1 (one) hour prior to your scheduled time, unless otherwise directed by the surgical center/hospital staff.  You must have a responsible adult accompany you, remain during the surgery and drive you home.  Make sure you have directions to the surgical center/hospital to ensure you arrive on time. 2. If you are having surgery at [facility] or [facility], you will need a copy of your medical history and physical form from your family physician within one month prior to the date of surgery. We will give you a form for your primary physician to complete.  3. We make every effort to accommodate the date you request for surgery.  However, there are times where surgery dates or times have to be moved.  We will contact you as soon as possible if a change in schedule is required.   4. No aspirin/ibuprofen for one week before surgery.  If you are on aspirin, any non-steroidal anti-inflammatory medications (Mobic, Aleve, Ibuprofen) should not be taken seven (7) days prior to your surgery.  You make take Tylenol for pain prior to surgery.  5. Medications - If you are taking daily heart and blood pressure medications, seizure, reflux, allergy, asthma, anxiety, pain or diabetes medications, make sure you notify the surgery center/hospital before the day of surgery so they can tell you which medications you should take or avoid the day of surgery. 6. No food or drink after midnight the night before surgery unless directed otherwise by surgical center/hospital staff. 7. No alcoholic beverages 24-hours prior to surgery.  No smoking 24-hours prior or 24-hours after  surgery. 8. Wear loose pants or shorts. They should be loose enough to fit over bandages, boots, and casts. 9. Don't wear slip-on shoes. Sneakers are preferred. 10. Bring your boot with you to the surgery center/hospital.  Also bring crutches or a walker if your physician has prescribed it for you.  If you do not have this equipment, it will be provided for you after surgery. 11. If you have not been contacted by the surgery center/hospital by the day before your surgery, call to confirm the date and time of your surgery. 12. Leave-time from work may vary depending on the type of surgery you have.  Appropriate arrangements should be made prior to surgery with your employer. 13. Prescriptions will be provided immediately following surgery by your doctor.  Fill these as soon as possible after surgery and take the medication as directed. Pain medications will not be refilled on weekends and must be approved by the doctor. 14. Remove nail polish on the operative foot and avoid getting pedicures prior to surgery. 15. Wash the night before surgery.  The night before surgery wash the foot and leg well with water and the antibacterial soap provided. Be sure to pay special attention to beneath the toenails and in between the toes.  Wash for at least three (3) minutes. Rinse thoroughly with water and dry well with a towel.  Perform this wash unless told not to do so by your physician.  Enclosed: 1 Ice pack (please put in freezer the night before surgery)   1 Hibiclens skin cleaner     Pre-op instructions  If you have any questions regarding the instructions, please do not hesitate to call our office.  [place]: [address], , [place] [address] -- [phone]  [place]: [address] [place], [place] [address] -- [phone]  [place]: [address], [place], [place] [address] -- [phone]   Website: [email or web address] 

## 2019-09-12 ENCOUNTER — Encounter: Payer: Self-pay | Admitting: Sports Medicine

## 2019-09-12 NOTE — Progress Notes (Signed)
Subjective: Isabella Cannon is a 59 y.o. female returns to office for follow up evaluation of left foot pain reports that her heel and side of her foot hurt reports that nothing has helped reports that she feels like the pain is a little worse since last visit icing helps to numb the area for a while but nothing seems to give any long-term relief even the brace and stretching are not helping.  Patient reports that she will be out of school for the summer.  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  Patient Active Problem List   Diagnosis Date Noted  . Epistaxis, recurrent 05/18/2019  . Pulmonary nodule 08/28/2015  . Major depressive disorder with single episode, in full remission ([place]) 08/28/2015  . Esophageal reflux 08/28/2015    Current Outpatient Medications on File Prior to Visit  Medication Sig Dispense Refill  . buPROPion (WELLBUTRIN SR) 150 MG 12 hr tablet Take 1 tablet (150 mg total) by mouth 2 (two) times daily. (Patient taking differently: Take 300 mg by mouth daily. ) 180 tablet 0  . ibuprofen (ADVIL) 800 MG tablet Take 800 mg by mouth every 4 (four) hours as needed.    . methylPREDNISolone (MEDROL DOSEPAK) 4 MG TBPK tablet Take as directed 21 tablet 0  . ondansetron (ZOFRAN ODT) 8 MG disintegrating tablet Take 1 tablet (8 mg total) by mouth every 8 (eight) hours as needed for nausea or vomiting. 10 tablet 0  . oxyCODONE-acetaminophen (PERCOCET/ROXICET) 5-325 MG tablet Take 1 tablet by mouth every 4 (four) hours as needed for severe pain. 15 tablet 0  . pravastatin (PRAVACHOL) 20 MG tablet TAKE 1 TABLET BY MOUTH EVERY DAY 30 tablet 5  . pravastatin (PRAVACHOL) 40 MG tablet Take 40 mg by mouth daily.     . predniSONE (STERAPRED UNI-PAK 21 TAB) 10 MG (21) TBPK tablet Take as directed 21 tablet 0  . Probiotic Product (PROBIOTIC PO) Take 1 capsule by mouth daily.    . tamsulosin (FLOMAX) 0.4 MG CAPS capsule Take 1 capsule (0.4 mg total) by mouth daily. 10 capsule 0   Current  Facility-Administered Medications on File Prior to Visit  Medication Dose Route Frequency Provider Last Rate Last Admin  . triamcinolone acetonide (KENALOG) 10 MG/ML injection 10 mg  10 mg Other Once [place], [place] Iannello, DPM      . triamcinolone acetonide (KENALOG-40) injection 20 mg  20 mg Other Once Landis Martins, DPM        No Known Allergies   Family History  Problem Relation Age of Onset  . COPD Father   . Heart disease Father        chf, MI  . Hyperlipidemia Father   . Hypertension Father   . Arthritis Sister   . Colon cancer Neg Hx   . Esophageal cancer Neg Hx   . Rectal cancer Neg Hx   . Stomach cancer Neg Hx     Social History   Socioeconomic History  . Marital status: Single    Spouse name: Not on file  . Number of children: Not on file  . Years of education: Not on file  . Highest education level: Not on file  Occupational History  . Not on file  Tobacco Use  . Smoking status: Current Some Day Smoker    Last attempt to quit: 12/05/2014    Years since quitting: 4.7  . Smokeless tobacco: Never Used  Substance and Sexual Activity  . Alcohol use: Yes    Alcohol/week: 12.0 standard  drinks    Types: 12 Cans of beer per week    Comment: 4-5 drinks in a day; 2 times per week  . Drug use: No  . Sexual activity: Not on file  Other Topics Concern  . 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 Difficulty of Paying Living Expenses:   Food Insecurity:   . Worried About Charity fundraiser in the Last Year:   . Arboriculturist in the Last Year:   Transportation Needs:   . Film/video editor (Medical):   Marland Kitchen Lack of Transportation (Non-Medical):   Physical Activity:   . Days of Exercise per Week:   .  Minutes of Exercise per Session:   Stress:   . Feeling of Stress :   Social Connections:   . Frequency of Communication with Friends and Family:   . Frequency of Social Gatherings with Friends and Family:   . Attends Religious Services:   . Active Member of Clubs or Organizations:   . Attends Archivist Meetings:   Marland Kitchen Marital Status:     Past Surgical History:  Procedure Laterality Date  . endometrial surgery       Objective:   General:  Alert and oriented x 3, in no acute distress  Dermatology: Skin is warm, dry, and supple bilateral. Nails are within normal limits. There is no lower extremity erythema, no eccymosis, no open lesions present bilateral.   Vascular: Dorsalis Pedis and Posterior Tibial pedal pulses are 2/4 bilateral. + hair growth noted bilateral. Capillary Fill Time is 3 seconds in all digits. No varicosities, No edema bilateral lower extremities.   Neurological: Sensation grossly intact to light touch.  Musculoskeletal: There is moderate tenderness to palpation at the medial calcaneal tubercale and through the insertion of the plantar fascia on the Left foot.  There is also pain at lateral ankle and peroneals on left. No pain with compression to calcaneus or application of tuning fork. There is decreased Ankle joint range of motion bilateral. All other joints range of motion  within normal limits bilateral. Strength 5/5 bilateral.   Assessment and Plan: Problem List Items Addressed This Visit    None    Visit Diagnoses    Plantar fasciitis of left foot    -  Primary   Tendonitis       Left foot pain       Intractable left heel pain       Equinus contracture of left ankle          -Complete examination performed.  -Xrays consistent with heel spur, no fracture  -Re-Discussed with patient in detail the condition of plantar fasciitis, chronic and lateral ankle pain and equinus, how this occurs related to the foot type of the patient and general  treatment options. -Patient opt for surgical management. Consent obtained for EPF, peroneal tendon evaluation with possible repair, and gastroc/Achilles lengthening on left. Pre and Post op course explained. Risks, benefits, alternatives explained. No guarantees given or implied. Surgical booking slip submitted and provided patient with Surgical packet and info for [facility] -To dispense crutches and  cam boot at surgical center -Continue with stretching as tolerated, icing and may return to CAM boot for bad flare and Tri-Lock as tolerated for the time being for normal pain -Advised patient if pain is bad if she still has a few more tablets of her Norco advised her to take that to help -Since patient is symptomatically having a lot of pain will forego a MRI at this time to get her scheduled for surgery since clinically her pain is consistent with tendinitis however there is suspicion suspicion of a tear so we will investigate the peroneal tendon and repair that accordingly -Patient to return to office if symptoms fail to improve.   Landis Martins, DPM

## 2019-10-16 ENCOUNTER — Telehealth: Payer: Self-pay

## 2019-10-16 NOTE — Telephone Encounter (Signed)
DOS 10/29/2019  EPF LT - 29798 GASTROCNEMIUS RECESS LT - 92119 REPAIR PERONEAL TENDON LT - 41740  [employer] ST EFFECTIVE DATE - 04/06/2019  PLAN DEDUCTIBLE - $1250.00 W/$1250.00 REMANING OUT OF POCKET - $4890.00 C/$1448.18 REMAINING COPAY $0.00 COINSURANCE - 20% PER SERVICE YEAR NO AUTH REQUIRED PER WEBSITE

## 2019-10-28 ENCOUNTER — Other Ambulatory Visit: Payer: Self-pay | Admitting: Sports Medicine

## 2019-10-28 NOTE — Progress Notes (Signed)
Post op meds entered and sent to pharmacy

## 2019-10-29 ENCOUNTER — Encounter: Payer: Self-pay | Admitting: Sports Medicine

## 2019-10-29 DIAGNOSIS — M216X2 Other acquired deformities of left foot: Secondary | ICD-10-CM

## 2019-10-29 DIAGNOSIS — M7732 Calcaneal spur, left foot: Secondary | ICD-10-CM

## 2019-10-29 DIAGNOSIS — M722 Plantar fascial fibromatosis: Secondary | ICD-10-CM | POA: Diagnosis not present

## 2019-10-29 DIAGNOSIS — M65872 Other synovitis and tenosynovitis, left ankle and foot: Secondary | ICD-10-CM | POA: Diagnosis not present

## 2019-10-29 MED ORDER — DOCUSATE SODIUM 100 MG PO CAPS
100.0000 mg | ORAL_CAPSULE | Freq: Two times a day (BID) | ORAL | 0 refills | Status: AC
Start: 2019-10-29 — End: ?

## 2019-10-29 MED ORDER — PROMETHAZINE HCL 25 MG PO TABS
25.0000 mg | ORAL_TABLET | Freq: Three times a day (TID) | ORAL | 0 refills | Status: AC | PRN
Start: 2019-10-29 — End: ?

## 2019-10-29 MED ORDER — HYDROCODONE-ACETAMINOPHEN 10-325 MG PO TABS: 1.0000 | ORAL_TABLET | Freq: Four times a day (QID) | ORAL | 0 refills | Status: AC | PRN

## 2019-10-29 MED ORDER — ASPIRIN EC 325 MG PO TBEC
325.0000 mg | DELAYED_RELEASE_TABLET | Freq: Every day | ORAL | 0 refills | Status: DC
Start: 2019-10-29 — End: 2019-11-26

## 2019-10-29 MED ORDER — IBUPROFEN 800 MG PO TABS: 800.0000 mg | ORAL_TABLET | Freq: Three times a day (TID) | ORAL | 0 refills | Status: AC | PRN

## 2019-10-30 ENCOUNTER — Telehealth: Payer: Self-pay | Admitting: *Deleted

## 2019-10-30 ENCOUNTER — Telehealth: Payer: Self-pay | Admitting: Sports Medicine

## 2019-10-30 NOTE — Telephone Encounter (Signed)
Ok great, thanks!

## 2019-10-30 NOTE — Telephone Encounter (Signed)
Postoperative check phone call made to patient.  Patient did not answer left voicemail explaining for her to call office if she has any postoperative problems or concerns.  Callback number was provided. -Dr. Cannon Kettle

## 2019-10-30 NOTE — Telephone Encounter (Signed)
Left message informing pt that it was not unusual to have post op bleeding, it could happen due to having to ride home with the foot down, being up on the foot or having it below the heart for more than 15 minutes/hour, or even if you did everything right, it could happen. I told pt that we would be more concerned if the area was greater than a fifty cent piece on the ace and if it was still wet. I told pt to continue to rest and elevate, she may mark the currently noticed area with a pen and report if it remained wet or exceeded the marked margins.

## 2019-10-30 NOTE — Telephone Encounter (Signed)
Pt states she has noticed some bleeding on the ace.

## 2019-11-08 ENCOUNTER — Ambulatory Visit (INDEPENDENT_AMBULATORY_CARE_PROVIDER_SITE_OTHER): Payer: BC Managed Care – PPO | Admitting: Sports Medicine

## 2019-11-08 ENCOUNTER — Other Ambulatory Visit: Payer: Self-pay

## 2019-11-08 ENCOUNTER — Encounter: Payer: Self-pay | Admitting: Sports Medicine

## 2019-11-08 DIAGNOSIS — M24572 Contracture, left ankle: Secondary | ICD-10-CM

## 2019-11-08 DIAGNOSIS — Z9889 Other specified postprocedural states: Secondary | ICD-10-CM

## 2019-11-08 DIAGNOSIS — M779 Enthesopathy, unspecified: Secondary | ICD-10-CM

## 2019-11-08 DIAGNOSIS — M722 Plantar fascial fibromatosis: Secondary | ICD-10-CM

## 2019-11-08 DIAGNOSIS — M79672 Pain in left foot: Secondary | ICD-10-CM

## 2019-11-08 NOTE — Progress Notes (Signed)
Subjective: Isabella Cannon is a 59 y.o. female patient seen today in office for POV #1 (DOS 10-29-19), S/P left gastroc release, EPF, and peroneal tendon repair.  Patient denies pain at surgical site reports that it does not bother her that much but is having a hard time walking with crutches, denies calf pain, denies headache, chest pain, shortness of breath, nausea, vomiting, fever, or chills.  Patient has questions about her FMLA/disability paperwork.  No other issues noted.   Patient Active Problem List   Diagnosis Date Noted  . Epistaxis, recurrent 05/18/2019  . Pulmonary nodule 08/28/2015  . Major depressive disorder with single episode, in full remission ([place]) 08/28/2015  . Esophageal reflux 08/28/2015    Current Outpatient Medications on File Prior to Visit  Medication Sig Dispense Refill  . aspirin EC 325 MG tablet Take 1 tablet (325 mg total) by mouth daily. While nonweightbearing to prevent against a blood clot 30 tablet 0  . buPROPion (WELLBUTRIN SR) 150 MG 12 hr tablet Take 1 tablet (150 mg total) by mouth 2 (two) times daily. (Patient taking differently: Take 300 mg by mouth daily. ) 180 tablet 0  . docusate sodium (COLACE) 100 MG capsule Take 1 capsule (100 mg total) by mouth 2 (two) times daily. 10 capsule 0  . ibuprofen (ADVIL) 800 MG tablet Take 1 tablet (800 mg total) by mouth every 8 (eight) hours as needed. 30 tablet 0  . methylPREDNISolone (MEDROL DOSEPAK) 4 MG TBPK tablet Take as directed 21 tablet 0  . ondansetron (ZOFRAN ODT) 8 MG disintegrating tablet Take 1 tablet (8 mg total) by mouth every 8 (eight) hours as needed for nausea or vomiting. 10 tablet 0  . oxyCODONE-acetaminophen (PERCOCET/ROXICET) 5-325 MG tablet Take 1 tablet by mouth every 4 (four) hours as needed for severe pain. 15 tablet 0  . pravastatin (PRAVACHOL) 20 MG tablet TAKE 1 TABLET BY MOUTH EVERY DAY 30 tablet 5  . pravastatin (PRAVACHOL) 40 MG tablet Take 40 mg by mouth daily.     . predniSONE (STERAPRED  UNI-PAK 21 TAB) 10 MG (21) TBPK tablet Take as directed 21 tablet 0  . Probiotic Product (PROBIOTIC PO) Take 1 capsule by mouth daily.    . promethazine (PHENERGAN) 25 MG tablet Take 1 tablet (25 mg total) by mouth every 8 (eight) hours as needed for nausea or vomiting. 20 tablet 0  . tamsulosin (FLOMAX) 0.4 MG CAPS capsule Take 1 capsule (0.4 mg total) by mouth daily. 10 capsule 0   Current Facility-Administered Medications on File Prior to Visit  Medication Dose Route Frequency Provider Last Rate Last Admin  . triamcinolone acetonide (KENALOG) 10 MG/ML injection 10 mg  10 mg Other Once Tornado, Cavon Nicolls, DPM      . triamcinolone acetonide (KENALOG-40) injection 20 mg  20 mg Other Once Landis Martins, DPM        No Known Allergies  Objective: There were no vitals filed for this visit.  General: No acute distress, AAOx3  Left foot: Sutures intact with no gapping or dehiscence at surgical sites, mild swelling to left foot, no erythema, no warmth, no drainage, no signs of infection noted, Capillary fill time <3 seconds in all digits, gross sensation present via light touch to left foot.  No pain with calf compression.   Assessment and Plan:  Problem List Items Addressed This Visit    None    Visit Diagnoses    Plantar fasciitis of left foot    -  Primary  Tendonitis       Left foot pain       Intractable left heel pain       Equinus contracture of left ankle       S/P foot surgery, left           -Patient seen and evaluated -Applied dry sterile dressing to surgical site left foot secured with ACE wrap and stockinet  -Continue with nonweightbearing with use of crutches -Advised patient to make sure to keep dressings clean, dry, and intact to left lower extremity -Advised patient to continue with cam boot protection while ambulating with crutches -Advised patient to limit activity to necessity  -Advised patient to ice and elevate as instructed -Continue with no work anticipated  time out of work is until 01/29/2020 to allow full recovery; patient met with [employer] and disability discussed paperwork -Will plan for possible suture removal at next office visit. In the meantime, patient to call office if any issues or problems arise.   Landis Martins, DPM

## 2019-11-15 ENCOUNTER — Other Ambulatory Visit: Payer: Self-pay

## 2019-11-15 ENCOUNTER — Encounter: Payer: Self-pay | Admitting: Sports Medicine

## 2019-11-15 ENCOUNTER — Ambulatory Visit (INDEPENDENT_AMBULATORY_CARE_PROVIDER_SITE_OTHER): Payer: BC Managed Care – PPO | Admitting: Sports Medicine

## 2019-11-15 DIAGNOSIS — M24572 Contracture, left ankle: Secondary | ICD-10-CM

## 2019-11-15 DIAGNOSIS — M779 Enthesopathy, unspecified: Secondary | ICD-10-CM

## 2019-11-15 DIAGNOSIS — M722 Plantar fascial fibromatosis: Secondary | ICD-10-CM

## 2019-11-15 DIAGNOSIS — Z9889 Other specified postprocedural states: Secondary | ICD-10-CM

## 2019-11-15 DIAGNOSIS — M79672 Pain in left foot: Secondary | ICD-10-CM

## 2019-11-15 NOTE — Progress Notes (Signed)
Subjective: Isabella Cannon is a 59 y.o. female patient seen today in office for POV #2 (DOS 10-29-19), S/P left gastroc release, EPF, and peroneal tendon repair.  Patient denies pain at surgical site but does admit some swelling.  No other issues noted.   Patient Active Problem List   Diagnosis Date Noted  . Epistaxis, recurrent 05/18/2019  . Pulmonary nodule 08/28/2015  . Major depressive disorder with single episode, in full remission ([place]) 08/28/2015  . Esophageal reflux 08/28/2015    Current Outpatient Medications on File Prior to Visit  Medication Sig Dispense Refill  . aspirin EC 325 MG tablet Take 1 tablet (325 mg total) by mouth daily. While nonweightbearing to prevent against a blood clot 30 tablet 0  . buPROPion (WELLBUTRIN SR) 150 MG 12 hr tablet Take 1 tablet (150 mg total) by mouth 2 (two) times daily. (Patient taking differently: Take 300 mg by mouth daily. ) 180 tablet 0  . docusate sodium (COLACE) 100 MG capsule Take 1 capsule (100 mg total) by mouth 2 (two) times daily. 10 capsule 0  . doxycycline (VIBRA-TABS) 100 MG tablet SMARTSIG:1 Tablet(s) By Mouth Every 12 Hours    . ibuprofen (ADVIL) 800 MG tablet Take 1 tablet (800 mg total) by mouth every 8 (eight) hours as needed. 30 tablet 0  . methylPREDNISolone (MEDROL DOSEPAK) 4 MG TBPK tablet Take as directed 21 tablet 0  . ondansetron (ZOFRAN ODT) 8 MG disintegrating tablet Take 1 tablet (8 mg total) by mouth every 8 (eight) hours as needed for nausea or vomiting. 10 tablet 0  . oxyCODONE-acetaminophen (PERCOCET/ROXICET) 5-325 MG tablet Take 1 tablet by mouth every 4 (four) hours as needed for severe pain. 15 tablet 0  . pravastatin (PRAVACHOL) 20 MG tablet TAKE 1 TABLET BY MOUTH EVERY DAY 30 tablet 5  . pravastatin (PRAVACHOL) 40 MG tablet Take 40 mg by mouth daily.     . predniSONE (STERAPRED UNI-PAK 21 TAB) 10 MG (21) TBPK tablet Take as directed 21 tablet 0  . Probiotic Product (PROBIOTIC PO) Take 1 capsule by mouth daily.     . promethazine (PHENERGAN) 25 MG tablet Take 1 tablet (25 mg total) by mouth every 8 (eight) hours as needed for nausea or vomiting. 20 tablet 0  . tamsulosin (FLOMAX) 0.4 MG CAPS capsule Take 1 capsule (0.4 mg total) by mouth daily. 10 capsule 0   Current Facility-Administered Medications on File Prior to Visit  Medication Dose Route Frequency Provider Last Rate Last Admin  . triamcinolone acetonide (KENALOG) 10 MG/ML injection 10 mg  10 mg Other Once [place], Donyale Falcon, DPM      . triamcinolone acetonide (KENALOG-40) injection 20 mg  20 mg Other Once Landis Martins, DPM        No Known Allergies  Objective: There were no vitals filed for this visit.  General: No acute distress, AAOx3  Left foot: Sutures and staples intact with no gapping or dehiscence at surgical sites, mild swelling to left foot, no erythema, no warmth, no drainage, no signs of infection noted, Capillary fill time <3 seconds in all digits, gross sensation present via light touch to left foot.  No pain with calf compression.   Assessment and Plan:  Problem List Items Addressed This Visit    None    Visit Diagnoses    Plantar fasciitis of left foot    -  Primary   Left foot pain       Intractable left heel pain  Tendonitis       Equinus contracture of left ankle       S/P foot surgery, left           -Patient seen and evaluated -Sutures were removed, staples left intact -Applied dry sterile dressing to surgical site left foot secured with ACE wrap and stockinet  -Continue with nonweightbearing with use of crutches -Advised patient to make sure to keep dressings clean, dry, and intact to left lower extremity -Advised patient to continue with cam boot protection while ambulating with crutches like previous  -Advised patient to limit activity to necessity  -Advised patient to ice and elevate as instructed -Continue with no work anticipated time out of work is until 01/29/2020 to allow full recovery -Advised  no driving  -Return next week for finishing staples.   Landis Martins, DPM

## 2019-11-20 ENCOUNTER — Other Ambulatory Visit: Payer: Self-pay | Admitting: Sports Medicine

## 2019-11-22 ENCOUNTER — Ambulatory Visit (INDEPENDENT_AMBULATORY_CARE_PROVIDER_SITE_OTHER): Payer: BC Managed Care – PPO | Admitting: Sports Medicine

## 2019-11-22 ENCOUNTER — Other Ambulatory Visit: Payer: Self-pay

## 2019-11-22 DIAGNOSIS — M779 Enthesopathy, unspecified: Secondary | ICD-10-CM

## 2019-11-22 DIAGNOSIS — M24572 Contracture, left ankle: Secondary | ICD-10-CM

## 2019-11-22 DIAGNOSIS — M79672 Pain in left foot: Secondary | ICD-10-CM

## 2019-11-22 DIAGNOSIS — M722 Plantar fascial fibromatosis: Secondary | ICD-10-CM

## 2019-11-22 NOTE — Progress Notes (Signed)
Subjective: Isabella Cannon is a 59 y.o. female patient seen today in office for POV #3 (DOS 10-29-19), S/P left gastroc release, EPF, and peroneal tendon repair.  Patient denies pain at surgical site but is nervous about staple removal, Norco this AM made her nauseous.  No other issues noted.   Patient Active Problem List   Diagnosis Date Noted   Epistaxis, recurrent 05/18/2019   Pulmonary nodule 08/28/2015   Major depressive disorder with single episode, in full remission ([place]) 08/28/2015   Esophageal reflux 08/28/2015    Current Outpatient Medications on File Prior to Visit  Medication Sig Dispense Refill   aspirin EC 325 MG tablet Take 1 tablet (325 mg total) by mouth daily. While nonweightbearing to prevent against a blood clot 30 tablet 0   buPROPion (WELLBUTRIN SR) 150 MG 12 hr tablet Take 1 tablet (150 mg total) by mouth 2 (two) times daily. (Patient taking differently: Take 300 mg by mouth daily. ) 180 tablet 0   docusate sodium (COLACE) 100 MG capsule Take 1 capsule (100 mg total) by mouth 2 (two) times daily. 10 capsule 0   doxycycline (VIBRA-TABS) 100 MG tablet SMARTSIG:1 Tablet(s) By Mouth Every 12 Hours     ibuprofen (ADVIL) 800 MG tablet Take 1 tablet (800 mg total) by mouth every 8 (eight) hours as needed. 30 tablet 0   methylPREDNISolone (MEDROL DOSEPAK) 4 MG TBPK tablet Take as directed 21 tablet 0   ondansetron (ZOFRAN ODT) 8 MG disintegrating tablet Take 1 tablet (8 mg total) by mouth every 8 (eight) hours as needed for nausea or vomiting. 10 tablet 0   oxyCODONE-acetaminophen (PERCOCET/ROXICET) 5-325 MG tablet Take 1 tablet by mouth every 4 (four) hours as needed for severe pain. 15 tablet 0   pravastatin (PRAVACHOL) 20 MG tablet TAKE 1 TABLET BY MOUTH EVERY DAY 30 tablet 5   pravastatin (PRAVACHOL) 40 MG tablet Take 40 mg by mouth daily.      predniSONE (STERAPRED UNI-PAK 21 TAB) 10 MG (21) TBPK tablet Take as directed 21 tablet 0   Probiotic Product  (PROBIOTIC PO) Take 1 capsule by mouth daily.     promethazine (PHENERGAN) 25 MG tablet Take 1 tablet (25 mg total) by mouth every 8 (eight) hours as needed for nausea or vomiting. 20 tablet 0   tamsulosin (FLOMAX) 0.4 MG CAPS capsule Take 1 capsule (0.4 mg total) by mouth daily. 10 capsule 0   Current Facility-Administered Medications on File Prior to Visit  Medication Dose Route Frequency Provider Last Rate Last Admin   triamcinolone acetonide (KENALOG) 10 MG/ML injection 10 mg  10 mg Other Once Landis Martins, DPM       triamcinolone acetonide (KENALOG-40) injection 20 mg  20 mg Other Once Landis Martins, DPM        No Known Allergies  Objective: There were no vitals filed for this visit.  General: No acute distress, AAOx3  Left foot: Sutures and staples intact with no gapping or dehiscence at surgical sites, mild swelling to left foot, no erythema, no warmth, no drainage, no signs of infection noted, Capillary fill time <3 seconds in all digits, gross sensation present via light touch to left foot.  No pain with calf compression.   Assessment and Plan:  Problem List Items Addressed This Visit    None    Visit Diagnoses    Plantar fasciitis of left foot    -  Primary   Left foot pain       Intractable left  heel pain       Tendonitis       Equinus contracture of left ankle           -Patient seen and evaluated -Remaining sutures and staples removed -Applied dry sterile dressing to surgical site left foot secured with ACE wrap and stockinet  -Patient may shower and remove dressing on tomorrow and redress with Ace wrap or clean sock -Patient may now weight-bear with cam boot -Advised patient to limit activity to necessity  -Advised patient to ice and elevate as instructed -Continue with no work anticipated time out of work is until 01/29/2020 to allow full recovery -Advised no driving until she is seen next office visit and will also discuss possibility of adding on  physical therapy -Return in 2 weeks for follow-up examination and discussion of adding on physical therapy  Landis Martins, DPM

## 2019-11-29 ENCOUNTER — Encounter: Payer: BC Managed Care – PPO | Admitting: Sports Medicine

## 2019-12-06 ENCOUNTER — Ambulatory Visit (INDEPENDENT_AMBULATORY_CARE_PROVIDER_SITE_OTHER): Payer: BC Managed Care – PPO | Admitting: Sports Medicine

## 2019-12-06 ENCOUNTER — Other Ambulatory Visit: Payer: Self-pay

## 2019-12-06 ENCOUNTER — Encounter: Payer: Self-pay | Admitting: Sports Medicine

## 2019-12-06 DIAGNOSIS — M722 Plantar fascial fibromatosis: Secondary | ICD-10-CM

## 2019-12-06 DIAGNOSIS — Z9889 Other specified postprocedural states: Secondary | ICD-10-CM

## 2019-12-06 DIAGNOSIS — M79672 Pain in left foot: Secondary | ICD-10-CM

## 2019-12-06 DIAGNOSIS — M24572 Contracture, left ankle: Secondary | ICD-10-CM

## 2019-12-06 DIAGNOSIS — M779 Enthesopathy, unspecified: Secondary | ICD-10-CM

## 2019-12-06 NOTE — Progress Notes (Signed)
Subjective: Isabella Cannon is a 59 y.o. female patient seen today in office for POV #4 (DOS 10-29-19), S/P left gastroc release, EPF, and peroneal tendon repair.  Patient admits some numbness to the baby toe but otherwise is doing okay.  Patient reports that she can walk with her boot but feels fearful to walk without it or her crutches.  No other issues noted.   Patient Active Problem List   Diagnosis Date Noted  . Epistaxis, recurrent 05/18/2019  . Pulmonary nodule 08/28/2015  . Major depressive disorder with single episode, in full remission ([place]) 08/28/2015  . Esophageal reflux 08/28/2015    Current Outpatient Medications on File Prior to Visit  Medication Sig Dispense Refill  . buPROPion (WELLBUTRIN SR) 150 MG 12 hr tablet Take 1 tablet (150 mg total) by mouth 2 (two) times daily. (Patient taking differently: Take 300 mg by mouth daily. ) 180 tablet 0  . [facility] ASPIRIN EC 325 MG EC tablet TAKE 1 TABLET BY MOUTH EVERY DAY *WHILE NONWEIGHTBEARING TO PREVENT BLOOD CLOT* 30 tablet 0  . docusate sodium (COLACE) 100 MG capsule Take 1 capsule (100 mg total) by mouth 2 (two) times daily. 10 capsule 0  . doxycycline (VIBRA-TABS) 100 MG tablet SMARTSIG:1 Tablet(s) By Mouth Every 12 Hours    . ibuprofen (ADVIL) 800 MG tablet Take 1 tablet (800 mg total) by mouth every 8 (eight) hours as needed. 30 tablet 0  . methylPREDNISolone (MEDROL DOSEPAK) 4 MG TBPK tablet Take as directed 21 tablet 0  . ondansetron (ZOFRAN ODT) 8 MG disintegrating tablet Take 1 tablet (8 mg total) by mouth every 8 (eight) hours as needed for nausea or vomiting. 10 tablet 0  . oxyCODONE-acetaminophen (PERCOCET/ROXICET) 5-325 MG tablet Take 1 tablet by mouth every 4 (four) hours as needed for severe pain. 15 tablet 0  . pravastatin (PRAVACHOL) 20 MG tablet TAKE 1 TABLET BY MOUTH EVERY DAY 30 tablet 5  . pravastatin (PRAVACHOL) 40 MG tablet Take 40 mg by mouth daily.     . predniSONE (STERAPRED UNI-PAK 21 TAB) 10 MG (21) TBPK tablet  Take as directed 21 tablet 0  . Probiotic Product (PROBIOTIC PO) Take 1 capsule by mouth daily.    . promethazine (PHENERGAN) 25 MG tablet Take 1 tablet (25 mg total) by mouth every 8 (eight) hours as needed for nausea or vomiting. 20 tablet 0  . tamsulosin (FLOMAX) 0.4 MG CAPS capsule Take 1 capsule (0.4 mg total) by mouth daily. 10 capsule 0   Current Facility-Administered Medications on File Prior to Visit  Medication Dose Route Frequency Provider Last Rate Last Admin  . triamcinolone acetonide (KENALOG) 10 MG/ML injection 10 mg  10 mg Other Once [place]bell, Demond Shallenberger, DPM      . triamcinolone acetonide (KENALOG-40) injection 20 mg  20 mg Other Once Landis Martins, DPM        No Known Allergies  Objective: There were no vitals filed for this visit.  General: No acute distress, AAOx3  Left foot: Incisions well healed with no gapping or dehiscence at surgical sites, mild swelling to left foot, no erythema, no warmth, no drainage, no signs of infection noted, Capillary fill time <3 seconds in all digits, gross sensation present via light touch to left foot.  Subjective numbness to the 5th toe. No pain with calf compression.   Assessment and Plan:  Problem List Items Addressed This Visit    None    Visit Diagnoses    Plantar fasciitis of left foot    -  Primary   Left foot pain       Intractable left heel pain       Tendonitis       Equinus contracture of left ankle       S/P foot surgery, left          -Patient seen and evaluated -Continue with ACE wrap for edema control -[facility] at [facility] to transition out of boot to normal shoe -Advised wait 1-2 weeks before she can drive -Continue with no work anticipated time out of work is until 01/29/2020 to allow full recovery -Return in 4  weeks for follow-up examination and discussion of adding on physical therapy  Landis Martins, DPM

## 2019-12-09 ENCOUNTER — Other Ambulatory Visit: Payer: Self-pay | Admitting: Sports Medicine

## 2019-12-14 ENCOUNTER — Encounter: Payer: Self-pay | Admitting: Sports Medicine

## 2020-01-03 ENCOUNTER — Other Ambulatory Visit: Payer: Self-pay

## 2020-01-03 ENCOUNTER — Ambulatory Visit (INDEPENDENT_AMBULATORY_CARE_PROVIDER_SITE_OTHER): Payer: BC Managed Care – PPO | Admitting: Sports Medicine

## 2020-01-03 ENCOUNTER — Encounter: Payer: Self-pay | Admitting: Sports Medicine

## 2020-01-03 DIAGNOSIS — M722 Plantar fascial fibromatosis: Secondary | ICD-10-CM

## 2020-01-03 DIAGNOSIS — M24572 Contracture, left ankle: Secondary | ICD-10-CM

## 2020-01-03 DIAGNOSIS — Z9889 Other specified postprocedural states: Secondary | ICD-10-CM

## 2020-01-03 DIAGNOSIS — M779 Enthesopathy, unspecified: Secondary | ICD-10-CM

## 2020-01-03 DIAGNOSIS — M79672 Pain in left foot: Secondary | ICD-10-CM

## 2020-01-03 NOTE — Progress Notes (Signed)
Subjective: Isabella Cannon is a 59 y.o. female patient seen today in office for POV #5 (DOS 10-29-19), S/P left gastroc release, EPF, and peroneal tendon repair.  Patient admits some numbness to the baby toe but otherwise is doing okay, has walked some with CAM boot. No other issues noted.   Patient Active Problem List   Diagnosis Date Noted  . Epistaxis, recurrent 05/18/2019  . Pulmonary nodule 08/28/2015  . Major depressive disorder with single episode, in full remission ([place]) 08/28/2015  . Esophageal reflux 08/28/2015    Current Outpatient Medications on File Prior to Visit  Medication Sig Dispense Refill  . aspirin 325 MG EC tablet TAKE 1 TABLET BY MOUTH EVERY DAY *WHILE NONWEIGHTBEARING TO PREVENT BLOOD CLOT* 90 tablet 1  . buPROPion (WELLBUTRIN SR) 150 MG 12 hr tablet Take 1 tablet (150 mg total) by mouth 2 (two) times daily. (Patient taking differently: Take 300 mg by mouth daily. ) 180 tablet 0  . docusate sodium (COLACE) 100 MG capsule Take 1 capsule (100 mg total) by mouth 2 (two) times daily. 10 capsule 0  . doxycycline (VIBRA-TABS) 100 MG tablet SMARTSIG:1 Tablet(s) By Mouth Every 12 Hours    . ibuprofen (ADVIL) 800 MG tablet Take 1 tablet (800 mg total) by mouth every 8 (eight) hours as needed. 30 tablet 0  . methylPREDNISolone (MEDROL DOSEPAK) 4 MG TBPK tablet Take as directed 21 tablet 0  . ondansetron (ZOFRAN ODT) 8 MG disintegrating tablet Take 1 tablet (8 mg total) by mouth every 8 (eight) hours as needed for nausea or vomiting. 10 tablet 0  . oxyCODONE-acetaminophen (PERCOCET/ROXICET) 5-325 MG tablet Take 1 tablet by mouth every 4 (four) hours as needed for severe pain. 15 tablet 0  . pravastatin (PRAVACHOL) 20 MG tablet TAKE 1 TABLET BY MOUTH EVERY DAY 30 tablet 5  . pravastatin (PRAVACHOL) 40 MG tablet Take 40 mg by mouth daily.     . predniSONE (STERAPRED UNI-PAK 21 TAB) 10 MG (21) TBPK tablet Take as directed 21 tablet 0  . Probiotic Product (PROBIOTIC PO) Take 1 capsule by  mouth daily.    . promethazine (PHENERGAN) 25 MG tablet Take 1 tablet (25 mg total) by mouth every 8 (eight) hours as needed for nausea or vomiting. 20 tablet 0  . tamsulosin (FLOMAX) 0.4 MG CAPS capsule Take 1 capsule (0.4 mg total) by mouth daily. 10 capsule 0   Current Facility-Administered Medications on File Prior to Visit  Medication Dose Route Frequency Provider Last Rate Last Admin  . triamcinolone acetonide (KENALOG) 10 MG/ML injection 10 mg  10 mg Other Once [place], Sahaana Weitman, DPM      . triamcinolone acetonide (KENALOG-40) injection 20 mg  20 mg Other Once Landis Martins, DPM        No Known Allergies  Objective: There were no vitals filed for this visit.  General: No acute distress, AAOx3  Left foot: Incisions well healed with no gapping or dehiscence at surgical sites, mild swelling to left foot, no erythema, no warmth, no drainage, no signs of infection noted, Capillary fill time <3 seconds in all digits, gross sensation present via light touch to left foot.  Subjective numbness to the 5th toe, slowly improving. No pain with calf compression.   Assessment and Plan:  Problem List Items Addressed This Visit    None    Visit Diagnoses    Plantar fasciitis of left foot    -  Primary   Left foot pain  Intractable left heel pain       Tendonitis       Equinus contracture of left ankle       S/P foot surgery, left          -Patient seen and evaluated -Continue with ACE wrap for edema control -Continue with PT -No work until 01/29/20  -Return in 3-4 weeks for final POV and discussion of return to work [employer], [place]

## 2020-01-24 ENCOUNTER — Encounter: Payer: Self-pay | Admitting: Sports Medicine

## 2020-01-24 ENCOUNTER — Other Ambulatory Visit: Payer: Self-pay

## 2020-01-24 ENCOUNTER — Ambulatory Visit (INDEPENDENT_AMBULATORY_CARE_PROVIDER_SITE_OTHER): Payer: BC Managed Care – PPO | Admitting: Sports Medicine

## 2020-01-24 DIAGNOSIS — M79672 Pain in left foot: Secondary | ICD-10-CM

## 2020-01-24 DIAGNOSIS — M779 Enthesopathy, unspecified: Secondary | ICD-10-CM

## 2020-01-24 DIAGNOSIS — Z9889 Other specified postprocedural states: Secondary | ICD-10-CM

## 2020-01-24 DIAGNOSIS — M24572 Contracture, left ankle: Secondary | ICD-10-CM

## 2020-01-24 DIAGNOSIS — M722 Plantar fascial fibromatosis: Secondary | ICD-10-CM

## 2020-01-24 NOTE — Progress Notes (Signed)
Subjective: Isabella Cannon is a 59 y.o. female patient seen today in office for POV #6 (DOS 10-29-19), S/P left gastroc release, EPF, and peroneal tendon repair.  Patient reports that she is doing good very minimal swelling and very minimal occasional pain but otherwise is ready to return to work soon.  No other issues noted.   Patient Active Problem List   Diagnosis Date Noted  . Epistaxis, recurrent 05/18/2019  . Pulmonary nodule 08/28/2015  . Major depressive disorder with single episode, in full remission ([place]) 08/28/2015  . Esophageal reflux 08/28/2015    Current Outpatient Medications on File Prior to Visit  Medication Sig Dispense Refill  . aspirin 325 MG EC tablet TAKE 1 TABLET BY MOUTH EVERY DAY *WHILE NONWEIGHTBEARING TO PREVENT BLOOD CLOT* 90 tablet 1  . buPROPion (WELLBUTRIN SR) 150 MG 12 hr tablet Take 1 tablet (150 mg total) by mouth 2 (two) times daily. (Patient taking differently: Take 300 mg by mouth daily. ) 180 tablet 0  . docusate sodium (COLACE) 100 MG capsule Take 1 capsule (100 mg total) by mouth 2 (two) times daily. 10 capsule 0  . doxycycline (VIBRA-TABS) 100 MG tablet SMARTSIG:1 Tablet(s) By Mouth Every 12 Hours    . ibuprofen (ADVIL) 800 MG tablet Take 1 tablet (800 mg total) by mouth every 8 (eight) hours as needed. 30 tablet 0  . methylPREDNISolone (MEDROL DOSEPAK) 4 MG TBPK tablet Take as directed 21 tablet 0  . ondansetron (ZOFRAN ODT) 8 MG disintegrating tablet Take 1 tablet (8 mg total) by mouth every 8 (eight) hours as needed for nausea or vomiting. 10 tablet 0  . oxyCODONE-acetaminophen (PERCOCET/ROXICET) 5-325 MG tablet Take 1 tablet by mouth every 4 (four) hours as needed for severe pain. 15 tablet 0  . pravastatin (PRAVACHOL) 20 MG tablet TAKE 1 TABLET BY MOUTH EVERY DAY 30 tablet 5  . pravastatin (PRAVACHOL) 40 MG tablet Take 40 mg by mouth daily.     . predniSONE (STERAPRED UNI-PAK 21 TAB) 10 MG (21) TBPK tablet Take as directed 21 tablet 0  . Probiotic  Product (PROBIOTIC PO) Take 1 capsule by mouth daily.    . promethazine (PHENERGAN) 25 MG tablet Take 1 tablet (25 mg total) by mouth every 8 (eight) hours as needed for nausea or vomiting. 20 tablet 0  . tamsulosin (FLOMAX) 0.4 MG CAPS capsule Take 1 capsule (0.4 mg total) by mouth daily. 10 capsule 0   Current Facility-Administered Medications on File Prior to Visit  Medication Dose Route Frequency Provider Last Rate Last Admin  . triamcinolone acetonide (KENALOG) 10 MG/ML injection 10 mg  10 mg Other Once Woodruff, Mayola Mcbain, DPM      . triamcinolone acetonide (KENALOG-40) injection 20 mg  20 mg Other Once Landis Martins, DPM        No Known Allergies  Objective: There were no vitals filed for this visit.  General: No acute distress, AAOx3  Left foot: Incisions well healed with very mild swelling to left foot, no erythema, no warmth, no drainage, no signs of infection noted, Capillary fill time <3 seconds in all digits, gross sensation present via light touch to left foot.  Subjective numbness to the 5th toe, that is continuing to improve. No pain with calf compression.   Assessment and Plan:  Problem List Items Addressed This Visit    None    Visit Diagnoses    Plantar fasciitis of left foot    -  Primary   Left foot pain  Intractable left heel pain       Tendonitis       Equinus contracture of left ankle       S/P foot surgery, left          -Patient seen and evaluated -Patient is medically cleared to return to work on 01/30/2020 with no restrictions -Advised patient she has swelling when she returns to work to use Ace wrap or compression sleeve -May slowly increase activities to tolerance and to continue with home PT -Return to office as needed or sooner problems or issues arise. Landis Martins, DPM

## 2020-05-26 ENCOUNTER — Other Ambulatory Visit: Payer: Self-pay

## 2020-05-26 ENCOUNTER — Other Ambulatory Visit: Payer: Self-pay | Admitting: Family Medicine

## 2020-05-26 ENCOUNTER — Ambulatory Visit: Payer: Self-pay

## 2020-05-26 DIAGNOSIS — M545 Low back pain, unspecified: Secondary | ICD-10-CM

## 2021-08-06 ENCOUNTER — Other Ambulatory Visit: Payer: Self-pay | Admitting: Orthopedic Surgery

## 2021-08-06 DIAGNOSIS — M545 Low back pain, unspecified: Secondary | ICD-10-CM

## 2021-08-27 ENCOUNTER — Ambulatory Visit
Admission: RE | Admit: 2021-08-27 | Discharge: 2021-08-27 | Disposition: A | Discharge status: Home / Self Care | Payer: Self-pay | Source: Ambulatory Visit | Attending: Orthopedic Surgery | Admitting: Orthopedic Surgery

## 2021-08-27 ENCOUNTER — Ambulatory Visit
Admission: RE | Admit: 2021-08-27 | Discharge: 2021-08-27 | Disposition: A | Discharge status: Home / Self Care | Payer: No Typology Code available for payment source | Source: Ambulatory Visit | Attending: Orthopedic Surgery | Admitting: Orthopedic Surgery

## 2021-08-27 DIAGNOSIS — M545 Low back pain, unspecified: Secondary | ICD-10-CM

## 2021-08-27 MED ORDER — BUPIVACAINE HCL (PF) 0.25 % IJ SOLN: 10.0000 mL | Freq: Once | INTRAMUSCULAR | Status: DC

## 2022-05-19 DIAGNOSIS — R7989 Other specified abnormal findings of blood chemistry: Secondary | ICD-10-CM | POA: Diagnosis not present

## 2022-05-19 DIAGNOSIS — E7800 Pure hypercholesterolemia, unspecified: Secondary | ICD-10-CM | POA: Diagnosis not present

## 2022-06-23 DIAGNOSIS — R82998 Other abnormal findings in urine: Secondary | ICD-10-CM | POA: Diagnosis not present

## 2023-01-21 IMAGING — CT CT BIOPSY
1 of 5 series · 13 of 32 positions shown, 19 images · non-contrast
Comparison: none

CLINICAL DATA: Chronic right-sided low back pain not improved after
right L4-L5 and L5-S1 facet ablation. Suspected right sacroiliac
joint dysfunction.

[Series 2: needle -guided injection · axial · 0.79mm/px · z∈[+878,+1008]mm · 13 of 77 slices shown, 19 images]
[im 6/77  soft-tissue]
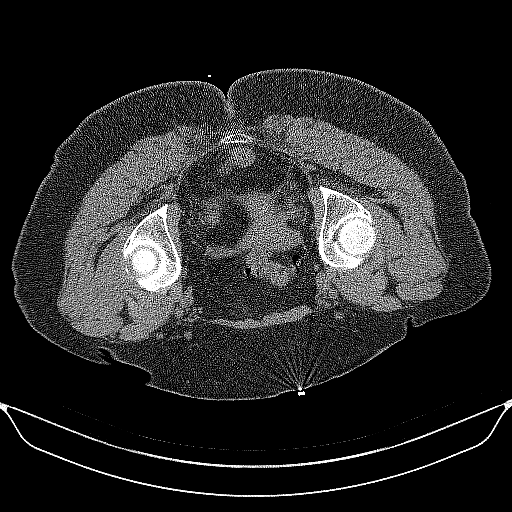
[im 6/77  bone]
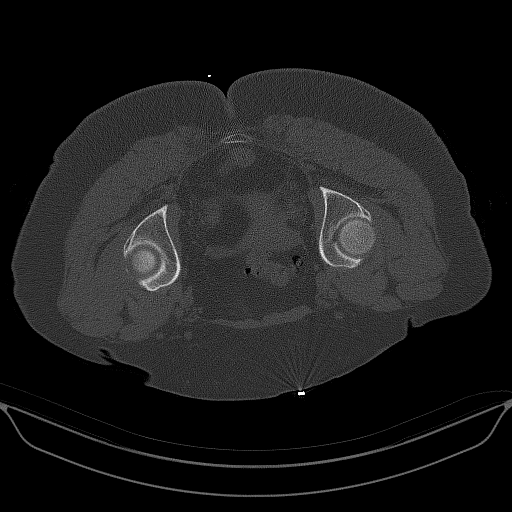
[im 11/77  soft-tissue]
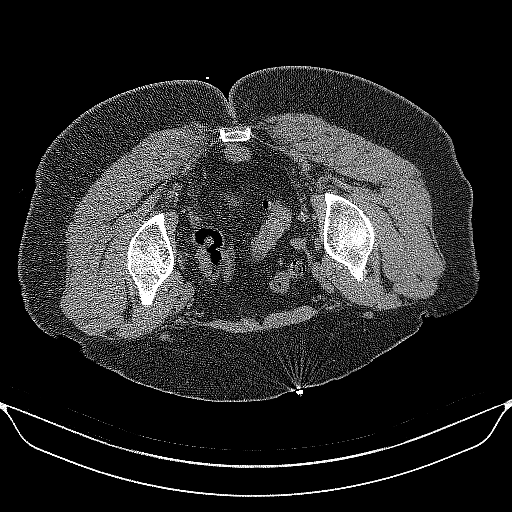
[im 16/77  soft-tissue]
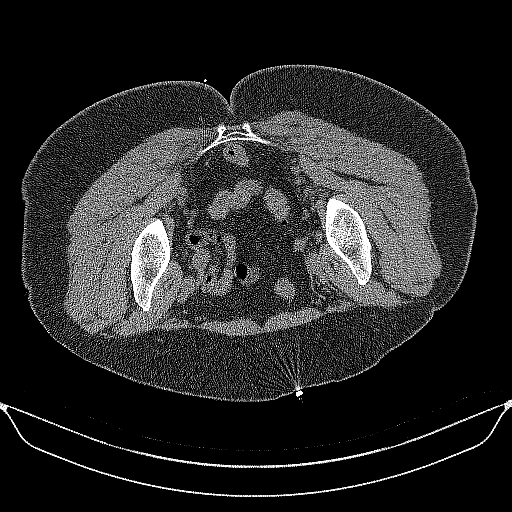
[im 21/77  soft-tissue]
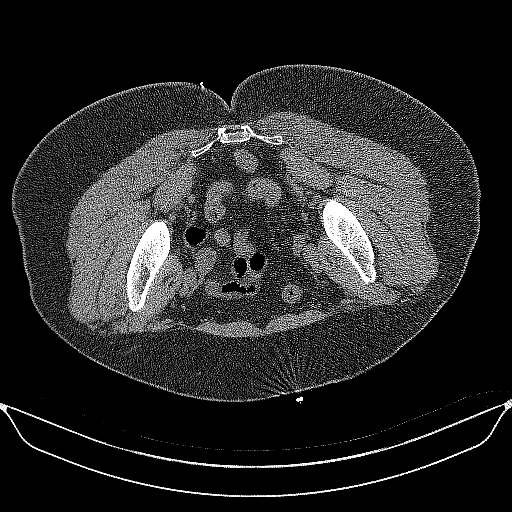
[im 26/77  soft-tissue]
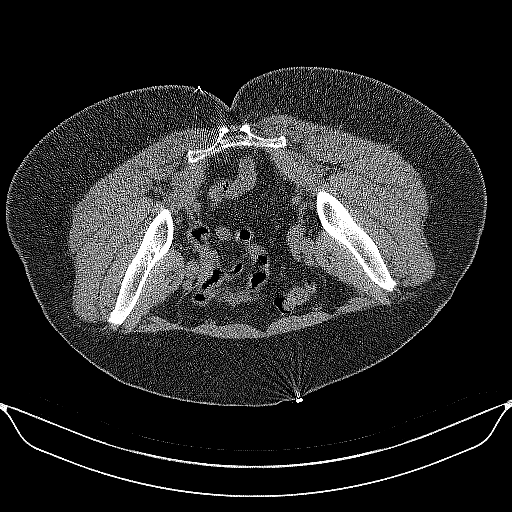
[im 31/77  soft-tissue]
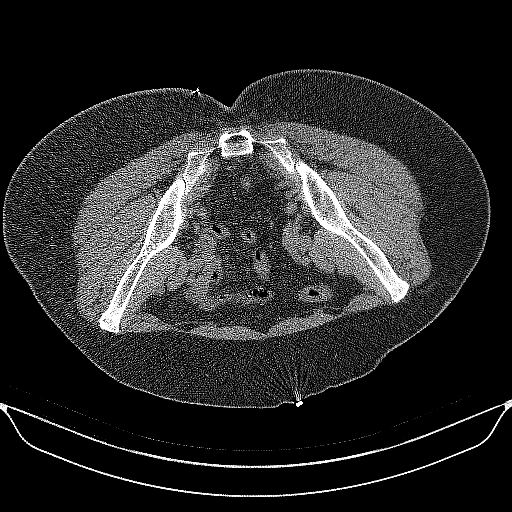
[im 41/77  soft-tissue]
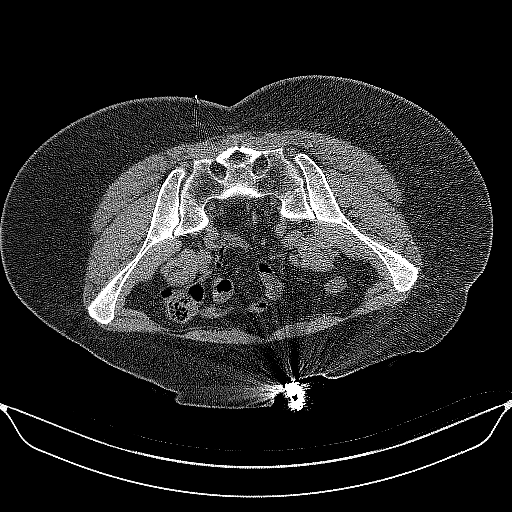
[im 46/77  soft-tissue]
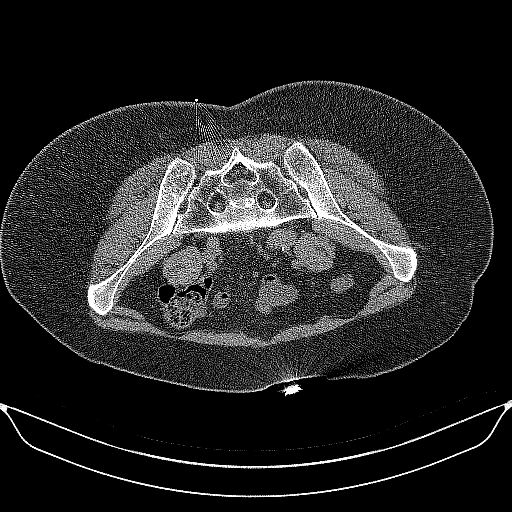
[im 51/77  soft-tissue]
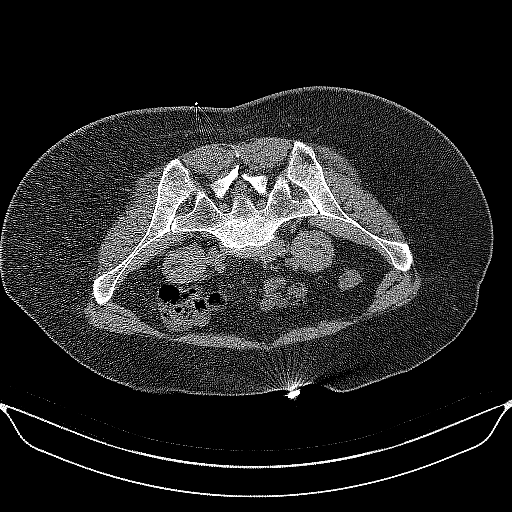
[im 51/77  bone]
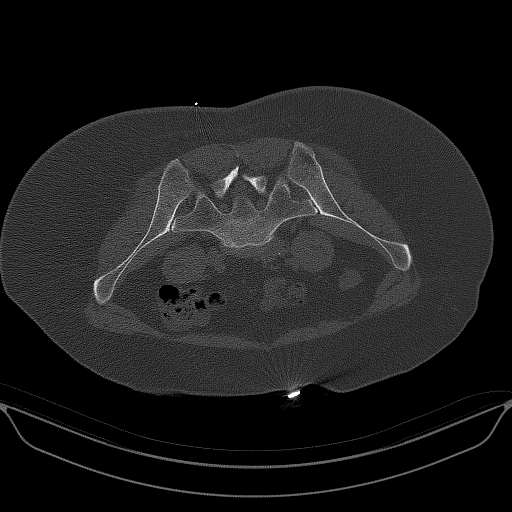
[im 56/77  soft-tissue]
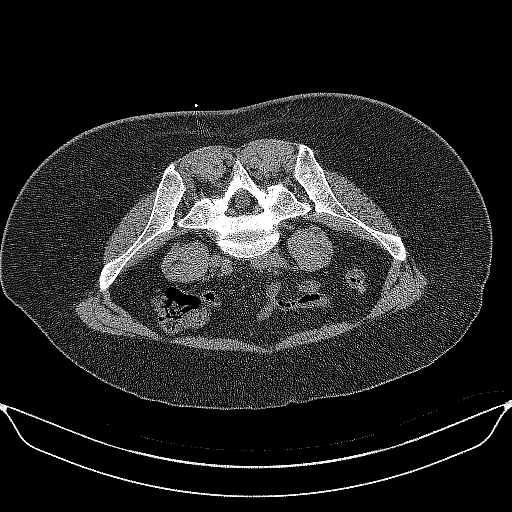
[im 56/77  lung]
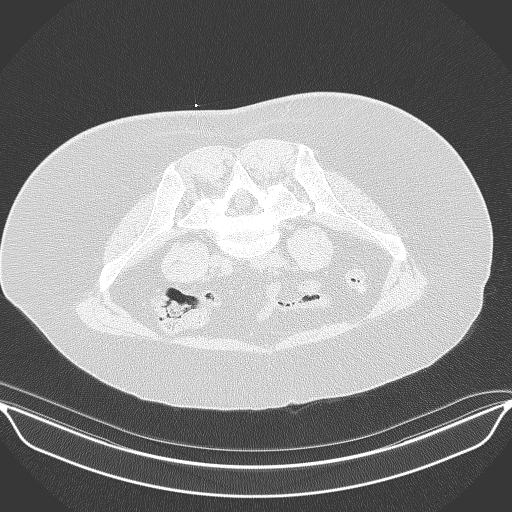
[im 61/77  soft-tissue]
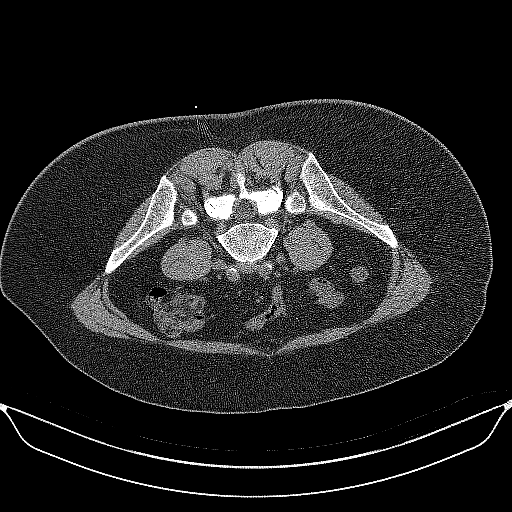
[im 61/77  lung]
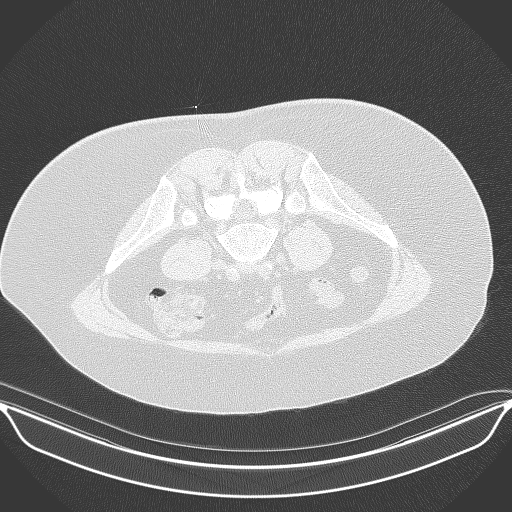
[im 66/77  soft-tissue]
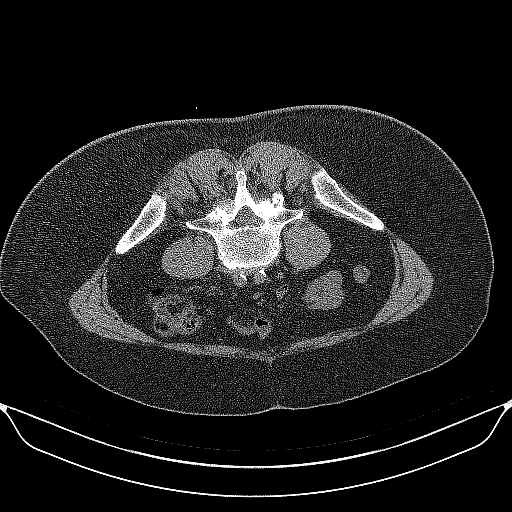
[im 66/77  lung]
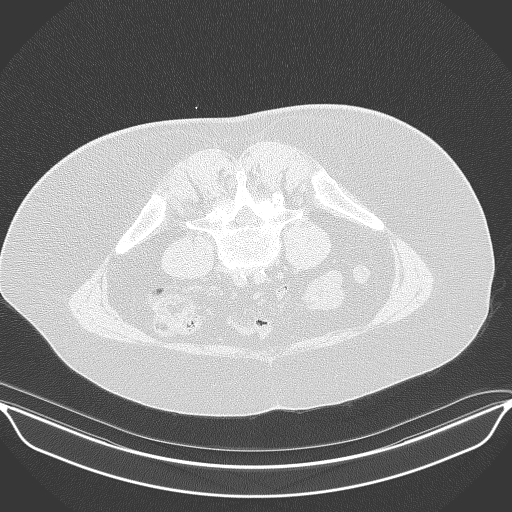
[im 71/77  soft-tissue]
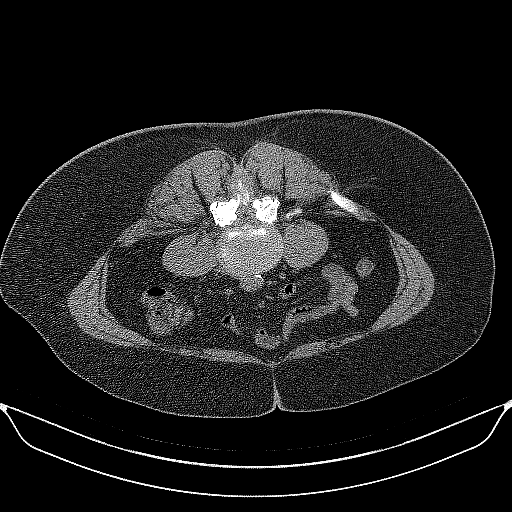
[im 71/77  lung]
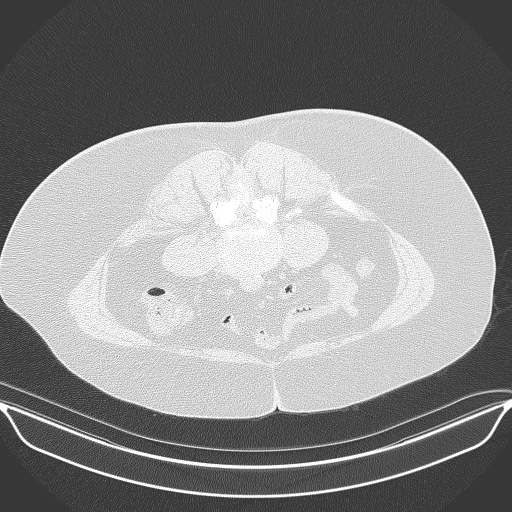

[13 of 32 positions shown; findings below may reference images not displayed]

EXAM:
CT-GUIDED RIGHT SACROILIAC JOINT INJECTION

PROCEDURE:
After a thorough discussion of risks and benefits of the procedure,
verbal and written consent was obtained. The patient was placed
prone on the CT table and localization was performed over the
sacrum. Target site was marked using CT guidance. The skin was
prepped and draped in the usual sterile fashion using Betadine soap.

After local anesthesia with 1% lidocaine without epinephrine and
subsequent deep anesthesia, a 3.5 inch 22 gauge spinal needle was
advanced into the right SI joint. Injection of 0.5 ml Isovue-M 200
confirmed intra-articular placement. No vascular uptake present.
Subsequently, 80 of Depo-Medrol mixed with 1 mL of 0.25% bupivacaine
were injected into the right SI joint. Needle was removed and a
sterile dressing applied.

No complications were observed. The patient was observed and
released under the care of a driver after 30 minutes.
IMPRESSION: Successful CT-guided right sacroiliac joint injection.
